# Patient Record
Sex: Female | Born: 1975 | Hispanic: No | Smoking: Current every day smoker
Health system: Southern US, Community
[De-identification: ages and names within clinical notes are randomized; demographics above are authoritative.]

## PROBLEM LIST (undated history)

## (undated) DIAGNOSIS — B49 Unspecified mycosis: Secondary | ICD-10-CM

## (undated) DIAGNOSIS — Z8619 Personal history of other infectious and parasitic diseases: Secondary | ICD-10-CM

## (undated) DIAGNOSIS — I82409 Acute embolism and thrombosis of unspecified deep veins of unspecified lower extremity: Secondary | ICD-10-CM

## (undated) DIAGNOSIS — G4730 Sleep apnea, unspecified: Secondary | ICD-10-CM

## (undated) DIAGNOSIS — I10 Essential (primary) hypertension: Secondary | ICD-10-CM

## (undated) DIAGNOSIS — M329 Systemic lupus erythematosus, unspecified: Secondary | ICD-10-CM

## (undated) DIAGNOSIS — N183 Chronic kidney disease, stage 3 (moderate): Secondary | ICD-10-CM

## (undated) DIAGNOSIS — N189 Chronic kidney disease, unspecified: Secondary | ICD-10-CM

## (undated) DIAGNOSIS — D649 Anemia, unspecified: Secondary | ICD-10-CM

## (undated) HISTORY — PX: TONSILECTOMY/ADENOIDECTOMY WITH MYRINGOTOMY: SHX6125

---

## 2010-11-06 HISTORY — PX: CHOLECYSTECTOMY: SHX55

## 2017-11-06 HISTORY — PX: OTHER SURGICAL HISTORY: SHX169

## 2017-11-06 HISTORY — PX: EMBOLIZATION: SHX1496

## 2017-11-06 HISTORY — PX: PEG TUBE PLACEMENT: SUR1034

## 2018-05-13 DIAGNOSIS — I82409 Acute embolism and thrombosis of unspecified deep veins of unspecified lower extremity: Secondary | ICD-10-CM | POA: Diagnosis not present

## 2018-05-13 DIAGNOSIS — Z9981 Dependence on supplemental oxygen: Secondary | ICD-10-CM

## 2018-05-13 DIAGNOSIS — M329 Systemic lupus erythematosus, unspecified: Secondary | ICD-10-CM

## 2018-05-13 DIAGNOSIS — J9620 Acute and chronic respiratory failure, unspecified whether with hypoxia or hypercapnia: Secondary | ICD-10-CM | POA: Diagnosis not present

## 2018-05-14 DIAGNOSIS — M329 Systemic lupus erythematosus, unspecified: Secondary | ICD-10-CM

## 2018-05-14 DIAGNOSIS — Z9981 Dependence on supplemental oxygen: Secondary | ICD-10-CM

## 2018-05-14 DIAGNOSIS — I82409 Acute embolism and thrombosis of unspecified deep veins of unspecified lower extremity: Secondary | ICD-10-CM

## 2018-05-14 DIAGNOSIS — J9620 Acute and chronic respiratory failure, unspecified whether with hypoxia or hypercapnia: Secondary | ICD-10-CM

## 2018-05-15 DIAGNOSIS — I82409 Acute embolism and thrombosis of unspecified deep veins of unspecified lower extremity: Secondary | ICD-10-CM | POA: Diagnosis not present

## 2018-05-15 DIAGNOSIS — M329 Systemic lupus erythematosus, unspecified: Secondary | ICD-10-CM

## 2018-05-15 DIAGNOSIS — J9620 Acute and chronic respiratory failure, unspecified whether with hypoxia or hypercapnia: Secondary | ICD-10-CM

## 2018-05-15 DIAGNOSIS — Z9981 Dependence on supplemental oxygen: Secondary | ICD-10-CM

## 2018-05-16 DIAGNOSIS — M329 Systemic lupus erythematosus, unspecified: Secondary | ICD-10-CM | POA: Diagnosis not present

## 2018-05-16 DIAGNOSIS — J9620 Acute and chronic respiratory failure, unspecified whether with hypoxia or hypercapnia: Secondary | ICD-10-CM

## 2018-05-16 DIAGNOSIS — Z9981 Dependence on supplemental oxygen: Secondary | ICD-10-CM | POA: Diagnosis not present

## 2018-05-16 DIAGNOSIS — I82409 Acute embolism and thrombosis of unspecified deep veins of unspecified lower extremity: Secondary | ICD-10-CM

## 2018-05-17 DIAGNOSIS — M329 Systemic lupus erythematosus, unspecified: Secondary | ICD-10-CM

## 2018-05-17 DIAGNOSIS — I82409 Acute embolism and thrombosis of unspecified deep veins of unspecified lower extremity: Secondary | ICD-10-CM

## 2018-05-17 DIAGNOSIS — J9620 Acute and chronic respiratory failure, unspecified whether with hypoxia or hypercapnia: Secondary | ICD-10-CM | POA: Diagnosis not present

## 2018-05-17 DIAGNOSIS — Z9981 Dependence on supplemental oxygen: Secondary | ICD-10-CM

## 2018-05-18 DIAGNOSIS — Z9981 Dependence on supplemental oxygen: Secondary | ICD-10-CM

## 2018-05-18 DIAGNOSIS — I82409 Acute embolism and thrombosis of unspecified deep veins of unspecified lower extremity: Secondary | ICD-10-CM

## 2018-05-18 DIAGNOSIS — M329 Systemic lupus erythematosus, unspecified: Secondary | ICD-10-CM | POA: Diagnosis not present

## 2018-05-18 DIAGNOSIS — J9620 Acute and chronic respiratory failure, unspecified whether with hypoxia or hypercapnia: Secondary | ICD-10-CM

## 2018-05-19 DIAGNOSIS — J9620 Acute and chronic respiratory failure, unspecified whether with hypoxia or hypercapnia: Secondary | ICD-10-CM | POA: Diagnosis not present

## 2018-05-19 DIAGNOSIS — I82409 Acute embolism and thrombosis of unspecified deep veins of unspecified lower extremity: Secondary | ICD-10-CM | POA: Diagnosis not present

## 2018-05-19 DIAGNOSIS — Z9981 Dependence on supplemental oxygen: Secondary | ICD-10-CM

## 2018-05-19 DIAGNOSIS — M329 Systemic lupus erythematosus, unspecified: Secondary | ICD-10-CM

## 2018-05-27 DIAGNOSIS — J9620 Acute and chronic respiratory failure, unspecified whether with hypoxia or hypercapnia: Secondary | ICD-10-CM | POA: Diagnosis not present

## 2018-05-27 DIAGNOSIS — Z9981 Dependence on supplemental oxygen: Secondary | ICD-10-CM

## 2018-05-27 DIAGNOSIS — I82409 Acute embolism and thrombosis of unspecified deep veins of unspecified lower extremity: Secondary | ICD-10-CM

## 2018-05-27 DIAGNOSIS — M329 Systemic lupus erythematosus, unspecified: Secondary | ICD-10-CM | POA: Diagnosis not present

## 2018-05-28 DIAGNOSIS — Z9981 Dependence on supplemental oxygen: Secondary | ICD-10-CM

## 2018-05-28 DIAGNOSIS — I82409 Acute embolism and thrombosis of unspecified deep veins of unspecified lower extremity: Secondary | ICD-10-CM

## 2018-05-28 DIAGNOSIS — M329 Systemic lupus erythematosus, unspecified: Secondary | ICD-10-CM

## 2018-05-28 DIAGNOSIS — J9620 Acute and chronic respiratory failure, unspecified whether with hypoxia or hypercapnia: Secondary | ICD-10-CM

## 2018-05-29 DIAGNOSIS — J9620 Acute and chronic respiratory failure, unspecified whether with hypoxia or hypercapnia: Secondary | ICD-10-CM

## 2018-05-29 DIAGNOSIS — M329 Systemic lupus erythematosus, unspecified: Secondary | ICD-10-CM

## 2018-05-29 DIAGNOSIS — I82409 Acute embolism and thrombosis of unspecified deep veins of unspecified lower extremity: Secondary | ICD-10-CM

## 2018-05-29 DIAGNOSIS — Z9981 Dependence on supplemental oxygen: Secondary | ICD-10-CM | POA: Diagnosis not present

## 2018-05-30 DIAGNOSIS — M329 Systemic lupus erythematosus, unspecified: Secondary | ICD-10-CM

## 2018-05-30 DIAGNOSIS — I82409 Acute embolism and thrombosis of unspecified deep veins of unspecified lower extremity: Secondary | ICD-10-CM

## 2018-05-30 DIAGNOSIS — Z9981 Dependence on supplemental oxygen: Secondary | ICD-10-CM

## 2018-05-30 DIAGNOSIS — J9620 Acute and chronic respiratory failure, unspecified whether with hypoxia or hypercapnia: Secondary | ICD-10-CM

## 2018-05-31 DIAGNOSIS — I82409 Acute embolism and thrombosis of unspecified deep veins of unspecified lower extremity: Secondary | ICD-10-CM | POA: Diagnosis not present

## 2018-05-31 DIAGNOSIS — Z9981 Dependence on supplemental oxygen: Secondary | ICD-10-CM | POA: Diagnosis not present

## 2018-05-31 DIAGNOSIS — J9620 Acute and chronic respiratory failure, unspecified whether with hypoxia or hypercapnia: Secondary | ICD-10-CM

## 2018-05-31 DIAGNOSIS — M329 Systemic lupus erythematosus, unspecified: Secondary | ICD-10-CM

## 2018-06-01 DIAGNOSIS — J9620 Acute and chronic respiratory failure, unspecified whether with hypoxia or hypercapnia: Secondary | ICD-10-CM

## 2018-06-01 DIAGNOSIS — M329 Systemic lupus erythematosus, unspecified: Secondary | ICD-10-CM

## 2018-06-01 DIAGNOSIS — I82409 Acute embolism and thrombosis of unspecified deep veins of unspecified lower extremity: Secondary | ICD-10-CM | POA: Diagnosis not present

## 2018-06-01 DIAGNOSIS — Z9981 Dependence on supplemental oxygen: Secondary | ICD-10-CM | POA: Diagnosis not present

## 2018-06-02 DIAGNOSIS — Z9981 Dependence on supplemental oxygen: Secondary | ICD-10-CM

## 2018-06-02 DIAGNOSIS — J9620 Acute and chronic respiratory failure, unspecified whether with hypoxia or hypercapnia: Secondary | ICD-10-CM | POA: Diagnosis not present

## 2018-06-02 DIAGNOSIS — M329 Systemic lupus erythematosus, unspecified: Secondary | ICD-10-CM

## 2018-06-02 DIAGNOSIS — I82409 Acute embolism and thrombosis of unspecified deep veins of unspecified lower extremity: Secondary | ICD-10-CM | POA: Diagnosis not present

## 2018-06-03 DIAGNOSIS — I82409 Acute embolism and thrombosis of unspecified deep veins of unspecified lower extremity: Secondary | ICD-10-CM | POA: Diagnosis not present

## 2018-06-03 DIAGNOSIS — M329 Systemic lupus erythematosus, unspecified: Secondary | ICD-10-CM

## 2018-06-03 DIAGNOSIS — J9620 Acute and chronic respiratory failure, unspecified whether with hypoxia or hypercapnia: Secondary | ICD-10-CM

## 2018-06-03 DIAGNOSIS — Z9981 Dependence on supplemental oxygen: Secondary | ICD-10-CM

## 2018-06-04 DIAGNOSIS — I82409 Acute embolism and thrombosis of unspecified deep veins of unspecified lower extremity: Secondary | ICD-10-CM

## 2018-06-04 DIAGNOSIS — M329 Systemic lupus erythematosus, unspecified: Secondary | ICD-10-CM | POA: Diagnosis not present

## 2018-06-04 DIAGNOSIS — J9620 Acute and chronic respiratory failure, unspecified whether with hypoxia or hypercapnia: Secondary | ICD-10-CM

## 2018-06-04 DIAGNOSIS — Z9981 Dependence on supplemental oxygen: Secondary | ICD-10-CM

## 2018-06-05 DIAGNOSIS — I82409 Acute embolism and thrombosis of unspecified deep veins of unspecified lower extremity: Secondary | ICD-10-CM | POA: Diagnosis not present

## 2018-06-05 DIAGNOSIS — M329 Systemic lupus erythematosus, unspecified: Secondary | ICD-10-CM

## 2018-06-05 DIAGNOSIS — Z9981 Dependence on supplemental oxygen: Secondary | ICD-10-CM | POA: Diagnosis not present

## 2018-06-05 DIAGNOSIS — J9620 Acute and chronic respiratory failure, unspecified whether with hypoxia or hypercapnia: Secondary | ICD-10-CM

## 2018-06-06 DIAGNOSIS — M329 Systemic lupus erythematosus, unspecified: Secondary | ICD-10-CM

## 2018-06-06 DIAGNOSIS — Z9981 Dependence on supplemental oxygen: Secondary | ICD-10-CM

## 2018-06-06 DIAGNOSIS — J9620 Acute and chronic respiratory failure, unspecified whether with hypoxia or hypercapnia: Secondary | ICD-10-CM

## 2018-06-06 DIAGNOSIS — I82409 Acute embolism and thrombosis of unspecified deep veins of unspecified lower extremity: Secondary | ICD-10-CM

## 2018-06-07 DIAGNOSIS — I82409 Acute embolism and thrombosis of unspecified deep veins of unspecified lower extremity: Secondary | ICD-10-CM

## 2018-06-07 DIAGNOSIS — M329 Systemic lupus erythematosus, unspecified: Secondary | ICD-10-CM

## 2018-06-07 DIAGNOSIS — Z9981 Dependence on supplemental oxygen: Secondary | ICD-10-CM

## 2018-06-07 DIAGNOSIS — J9620 Acute and chronic respiratory failure, unspecified whether with hypoxia or hypercapnia: Secondary | ICD-10-CM | POA: Diagnosis not present

## 2018-06-08 DIAGNOSIS — J9620 Acute and chronic respiratory failure, unspecified whether with hypoxia or hypercapnia: Secondary | ICD-10-CM

## 2018-06-08 DIAGNOSIS — Z9981 Dependence on supplemental oxygen: Secondary | ICD-10-CM | POA: Diagnosis not present

## 2018-06-08 DIAGNOSIS — I82409 Acute embolism and thrombosis of unspecified deep veins of unspecified lower extremity: Secondary | ICD-10-CM

## 2018-06-08 DIAGNOSIS — M329 Systemic lupus erythematosus, unspecified: Secondary | ICD-10-CM

## 2018-06-09 DIAGNOSIS — Z9981 Dependence on supplemental oxygen: Secondary | ICD-10-CM | POA: Diagnosis not present

## 2018-06-09 DIAGNOSIS — I82409 Acute embolism and thrombosis of unspecified deep veins of unspecified lower extremity: Secondary | ICD-10-CM

## 2018-06-09 DIAGNOSIS — J9620 Acute and chronic respiratory failure, unspecified whether with hypoxia or hypercapnia: Secondary | ICD-10-CM

## 2018-06-09 DIAGNOSIS — M329 Systemic lupus erythematosus, unspecified: Secondary | ICD-10-CM

## 2018-06-10 DIAGNOSIS — I82409 Acute embolism and thrombosis of unspecified deep veins of unspecified lower extremity: Secondary | ICD-10-CM

## 2018-06-10 DIAGNOSIS — J9620 Acute and chronic respiratory failure, unspecified whether with hypoxia or hypercapnia: Secondary | ICD-10-CM

## 2018-06-10 DIAGNOSIS — Z9981 Dependence on supplemental oxygen: Secondary | ICD-10-CM

## 2018-06-10 DIAGNOSIS — M329 Systemic lupus erythematosus, unspecified: Secondary | ICD-10-CM

## 2018-06-11 DIAGNOSIS — J9620 Acute and chronic respiratory failure, unspecified whether with hypoxia or hypercapnia: Secondary | ICD-10-CM

## 2018-06-11 DIAGNOSIS — I82409 Acute embolism and thrombosis of unspecified deep veins of unspecified lower extremity: Secondary | ICD-10-CM

## 2018-06-11 DIAGNOSIS — Z9981 Dependence on supplemental oxygen: Secondary | ICD-10-CM

## 2018-06-11 DIAGNOSIS — M329 Systemic lupus erythematosus, unspecified: Secondary | ICD-10-CM

## 2018-06-12 DIAGNOSIS — J9620 Acute and chronic respiratory failure, unspecified whether with hypoxia or hypercapnia: Secondary | ICD-10-CM

## 2018-06-12 DIAGNOSIS — I82409 Acute embolism and thrombosis of unspecified deep veins of unspecified lower extremity: Secondary | ICD-10-CM

## 2018-06-12 DIAGNOSIS — M329 Systemic lupus erythematosus, unspecified: Secondary | ICD-10-CM | POA: Diagnosis not present

## 2018-06-12 DIAGNOSIS — Z9981 Dependence on supplemental oxygen: Secondary | ICD-10-CM

## 2018-06-13 DIAGNOSIS — J9620 Acute and chronic respiratory failure, unspecified whether with hypoxia or hypercapnia: Secondary | ICD-10-CM

## 2018-06-13 DIAGNOSIS — Z9981 Dependence on supplemental oxygen: Secondary | ICD-10-CM | POA: Diagnosis not present

## 2018-06-13 DIAGNOSIS — I82409 Acute embolism and thrombosis of unspecified deep veins of unspecified lower extremity: Secondary | ICD-10-CM | POA: Diagnosis not present

## 2018-06-13 DIAGNOSIS — M329 Systemic lupus erythematosus, unspecified: Secondary | ICD-10-CM

## 2018-06-14 DIAGNOSIS — I82409 Acute embolism and thrombosis of unspecified deep veins of unspecified lower extremity: Secondary | ICD-10-CM | POA: Diagnosis not present

## 2018-06-14 DIAGNOSIS — J9620 Acute and chronic respiratory failure, unspecified whether with hypoxia or hypercapnia: Secondary | ICD-10-CM | POA: Diagnosis not present

## 2018-06-14 DIAGNOSIS — Z9981 Dependence on supplemental oxygen: Secondary | ICD-10-CM

## 2018-06-14 DIAGNOSIS — M329 Systemic lupus erythematosus, unspecified: Secondary | ICD-10-CM | POA: Diagnosis not present

## 2018-06-15 DIAGNOSIS — I82409 Acute embolism and thrombosis of unspecified deep veins of unspecified lower extremity: Secondary | ICD-10-CM

## 2018-06-15 DIAGNOSIS — Z9981 Dependence on supplemental oxygen: Secondary | ICD-10-CM

## 2018-06-15 DIAGNOSIS — M329 Systemic lupus erythematosus, unspecified: Secondary | ICD-10-CM | POA: Diagnosis not present

## 2018-06-15 DIAGNOSIS — J9620 Acute and chronic respiratory failure, unspecified whether with hypoxia or hypercapnia: Secondary | ICD-10-CM | POA: Diagnosis not present

## 2018-06-16 DIAGNOSIS — J9620 Acute and chronic respiratory failure, unspecified whether with hypoxia or hypercapnia: Secondary | ICD-10-CM

## 2018-06-16 DIAGNOSIS — M329 Systemic lupus erythematosus, unspecified: Secondary | ICD-10-CM

## 2018-06-16 DIAGNOSIS — I82409 Acute embolism and thrombosis of unspecified deep veins of unspecified lower extremity: Secondary | ICD-10-CM

## 2018-06-16 DIAGNOSIS — Z9981 Dependence on supplemental oxygen: Secondary | ICD-10-CM

## 2018-07-01 DIAGNOSIS — J9620 Acute and chronic respiratory failure, unspecified whether with hypoxia or hypercapnia: Secondary | ICD-10-CM | POA: Diagnosis not present

## 2018-07-01 DIAGNOSIS — M329 Systemic lupus erythematosus, unspecified: Secondary | ICD-10-CM | POA: Diagnosis not present

## 2018-07-01 DIAGNOSIS — Z9981 Dependence on supplemental oxygen: Secondary | ICD-10-CM

## 2018-07-01 DIAGNOSIS — I82409 Acute embolism and thrombosis of unspecified deep veins of unspecified lower extremity: Secondary | ICD-10-CM

## 2018-07-02 DIAGNOSIS — Z9981 Dependence on supplemental oxygen: Secondary | ICD-10-CM | POA: Diagnosis not present

## 2018-07-02 DIAGNOSIS — J9620 Acute and chronic respiratory failure, unspecified whether with hypoxia or hypercapnia: Secondary | ICD-10-CM

## 2018-07-02 DIAGNOSIS — I82409 Acute embolism and thrombosis of unspecified deep veins of unspecified lower extremity: Secondary | ICD-10-CM | POA: Diagnosis not present

## 2018-07-02 DIAGNOSIS — M329 Systemic lupus erythematosus, unspecified: Secondary | ICD-10-CM | POA: Diagnosis not present

## 2018-07-03 DIAGNOSIS — Z9981 Dependence on supplemental oxygen: Secondary | ICD-10-CM | POA: Diagnosis not present

## 2018-07-03 DIAGNOSIS — J9620 Acute and chronic respiratory failure, unspecified whether with hypoxia or hypercapnia: Secondary | ICD-10-CM

## 2018-07-03 DIAGNOSIS — M329 Systemic lupus erythematosus, unspecified: Secondary | ICD-10-CM

## 2018-07-03 DIAGNOSIS — I82409 Acute embolism and thrombosis of unspecified deep veins of unspecified lower extremity: Secondary | ICD-10-CM | POA: Diagnosis not present

## 2018-07-04 DIAGNOSIS — M329 Systemic lupus erythematosus, unspecified: Secondary | ICD-10-CM

## 2018-07-04 DIAGNOSIS — Z9981 Dependence on supplemental oxygen: Secondary | ICD-10-CM | POA: Diagnosis not present

## 2018-07-04 DIAGNOSIS — J9620 Acute and chronic respiratory failure, unspecified whether with hypoxia or hypercapnia: Secondary | ICD-10-CM | POA: Diagnosis not present

## 2018-07-04 DIAGNOSIS — I82409 Acute embolism and thrombosis of unspecified deep veins of unspecified lower extremity: Secondary | ICD-10-CM

## 2018-07-05 DIAGNOSIS — J9620 Acute and chronic respiratory failure, unspecified whether with hypoxia or hypercapnia: Secondary | ICD-10-CM | POA: Diagnosis not present

## 2018-07-05 DIAGNOSIS — Z9981 Dependence on supplemental oxygen: Secondary | ICD-10-CM | POA: Diagnosis not present

## 2018-07-05 DIAGNOSIS — M329 Systemic lupus erythematosus, unspecified: Secondary | ICD-10-CM

## 2018-07-05 DIAGNOSIS — I82409 Acute embolism and thrombosis of unspecified deep veins of unspecified lower extremity: Secondary | ICD-10-CM

## 2018-07-06 DIAGNOSIS — Z9981 Dependence on supplemental oxygen: Secondary | ICD-10-CM

## 2018-07-06 DIAGNOSIS — M329 Systemic lupus erythematosus, unspecified: Secondary | ICD-10-CM

## 2018-07-06 DIAGNOSIS — J9620 Acute and chronic respiratory failure, unspecified whether with hypoxia or hypercapnia: Secondary | ICD-10-CM

## 2018-07-06 DIAGNOSIS — I82409 Acute embolism and thrombosis of unspecified deep veins of unspecified lower extremity: Secondary | ICD-10-CM

## 2018-07-07 DIAGNOSIS — M329 Systemic lupus erythematosus, unspecified: Secondary | ICD-10-CM | POA: Diagnosis not present

## 2018-07-07 DIAGNOSIS — I82409 Acute embolism and thrombosis of unspecified deep veins of unspecified lower extremity: Secondary | ICD-10-CM

## 2018-07-07 DIAGNOSIS — Z9981 Dependence on supplemental oxygen: Secondary | ICD-10-CM | POA: Diagnosis not present

## 2018-07-07 DIAGNOSIS — J9620 Acute and chronic respiratory failure, unspecified whether with hypoxia or hypercapnia: Secondary | ICD-10-CM | POA: Diagnosis not present

## 2018-07-12 DIAGNOSIS — I82409 Acute embolism and thrombosis of unspecified deep veins of unspecified lower extremity: Secondary | ICD-10-CM

## 2018-07-12 DIAGNOSIS — J9620 Acute and chronic respiratory failure, unspecified whether with hypoxia or hypercapnia: Secondary | ICD-10-CM | POA: Diagnosis not present

## 2018-07-12 DIAGNOSIS — Z9981 Dependence on supplemental oxygen: Secondary | ICD-10-CM | POA: Diagnosis not present

## 2018-07-12 DIAGNOSIS — M329 Systemic lupus erythematosus, unspecified: Secondary | ICD-10-CM | POA: Diagnosis not present

## 2018-07-13 DIAGNOSIS — Z9981 Dependence on supplemental oxygen: Secondary | ICD-10-CM | POA: Diagnosis not present

## 2018-07-13 DIAGNOSIS — J9620 Acute and chronic respiratory failure, unspecified whether with hypoxia or hypercapnia: Secondary | ICD-10-CM | POA: Diagnosis not present

## 2018-07-13 DIAGNOSIS — I82409 Acute embolism and thrombosis of unspecified deep veins of unspecified lower extremity: Secondary | ICD-10-CM

## 2018-07-13 DIAGNOSIS — M329 Systemic lupus erythematosus, unspecified: Secondary | ICD-10-CM | POA: Diagnosis not present

## 2018-07-14 DIAGNOSIS — Z9981 Dependence on supplemental oxygen: Secondary | ICD-10-CM

## 2018-07-14 DIAGNOSIS — M329 Systemic lupus erythematosus, unspecified: Secondary | ICD-10-CM

## 2018-07-14 DIAGNOSIS — J9620 Acute and chronic respiratory failure, unspecified whether with hypoxia or hypercapnia: Secondary | ICD-10-CM

## 2018-07-14 DIAGNOSIS — I82409 Acute embolism and thrombosis of unspecified deep veins of unspecified lower extremity: Secondary | ICD-10-CM | POA: Diagnosis not present

## 2018-07-15 DIAGNOSIS — I82409 Acute embolism and thrombosis of unspecified deep veins of unspecified lower extremity: Secondary | ICD-10-CM | POA: Diagnosis not present

## 2018-07-15 DIAGNOSIS — J9620 Acute and chronic respiratory failure, unspecified whether with hypoxia or hypercapnia: Secondary | ICD-10-CM | POA: Diagnosis not present

## 2018-07-15 DIAGNOSIS — Z9981 Dependence on supplemental oxygen: Secondary | ICD-10-CM | POA: Diagnosis not present

## 2018-07-15 DIAGNOSIS — M329 Systemic lupus erythematosus, unspecified: Secondary | ICD-10-CM | POA: Diagnosis not present

## 2018-07-16 DIAGNOSIS — Z9981 Dependence on supplemental oxygen: Secondary | ICD-10-CM

## 2018-07-16 DIAGNOSIS — M329 Systemic lupus erythematosus, unspecified: Secondary | ICD-10-CM | POA: Diagnosis not present

## 2018-07-16 DIAGNOSIS — J9620 Acute and chronic respiratory failure, unspecified whether with hypoxia or hypercapnia: Secondary | ICD-10-CM | POA: Diagnosis not present

## 2018-07-16 DIAGNOSIS — I82409 Acute embolism and thrombosis of unspecified deep veins of unspecified lower extremity: Secondary | ICD-10-CM

## 2018-07-17 DIAGNOSIS — I82409 Acute embolism and thrombosis of unspecified deep veins of unspecified lower extremity: Secondary | ICD-10-CM

## 2018-07-17 DIAGNOSIS — J9620 Acute and chronic respiratory failure, unspecified whether with hypoxia or hypercapnia: Secondary | ICD-10-CM | POA: Diagnosis not present

## 2018-07-17 DIAGNOSIS — M329 Systemic lupus erythematosus, unspecified: Secondary | ICD-10-CM

## 2018-07-17 DIAGNOSIS — Z9981 Dependence on supplemental oxygen: Secondary | ICD-10-CM

## 2018-07-18 DIAGNOSIS — M329 Systemic lupus erythematosus, unspecified: Secondary | ICD-10-CM

## 2018-07-18 DIAGNOSIS — Z9981 Dependence on supplemental oxygen: Secondary | ICD-10-CM

## 2018-07-18 DIAGNOSIS — J9620 Acute and chronic respiratory failure, unspecified whether with hypoxia or hypercapnia: Secondary | ICD-10-CM | POA: Diagnosis not present

## 2018-07-18 DIAGNOSIS — I82409 Acute embolism and thrombosis of unspecified deep veins of unspecified lower extremity: Secondary | ICD-10-CM

## 2018-07-29 DIAGNOSIS — M329 Systemic lupus erythematosus, unspecified: Secondary | ICD-10-CM

## 2018-07-29 DIAGNOSIS — J9620 Acute and chronic respiratory failure, unspecified whether with hypoxia or hypercapnia: Secondary | ICD-10-CM | POA: Diagnosis not present

## 2018-07-29 DIAGNOSIS — Z9981 Dependence on supplemental oxygen: Secondary | ICD-10-CM | POA: Diagnosis not present

## 2018-07-29 DIAGNOSIS — I82409 Acute embolism and thrombosis of unspecified deep veins of unspecified lower extremity: Secondary | ICD-10-CM

## 2018-07-30 DIAGNOSIS — J9620 Acute and chronic respiratory failure, unspecified whether with hypoxia or hypercapnia: Secondary | ICD-10-CM | POA: Diagnosis not present

## 2018-07-30 DIAGNOSIS — M329 Systemic lupus erythematosus, unspecified: Secondary | ICD-10-CM

## 2018-07-30 DIAGNOSIS — Z9981 Dependence on supplemental oxygen: Secondary | ICD-10-CM

## 2018-07-30 DIAGNOSIS — I82409 Acute embolism and thrombosis of unspecified deep veins of unspecified lower extremity: Secondary | ICD-10-CM | POA: Diagnosis not present

## 2018-07-31 DIAGNOSIS — M329 Systemic lupus erythematosus, unspecified: Secondary | ICD-10-CM

## 2018-07-31 DIAGNOSIS — Z9981 Dependence on supplemental oxygen: Secondary | ICD-10-CM

## 2018-07-31 DIAGNOSIS — I82409 Acute embolism and thrombosis of unspecified deep veins of unspecified lower extremity: Secondary | ICD-10-CM | POA: Diagnosis not present

## 2018-07-31 DIAGNOSIS — J9620 Acute and chronic respiratory failure, unspecified whether with hypoxia or hypercapnia: Secondary | ICD-10-CM | POA: Diagnosis not present

## 2018-08-01 DIAGNOSIS — M329 Systemic lupus erythematosus, unspecified: Secondary | ICD-10-CM

## 2018-08-01 DIAGNOSIS — J9620 Acute and chronic respiratory failure, unspecified whether with hypoxia or hypercapnia: Secondary | ICD-10-CM

## 2018-08-01 DIAGNOSIS — I82409 Acute embolism and thrombosis of unspecified deep veins of unspecified lower extremity: Secondary | ICD-10-CM

## 2018-08-01 DIAGNOSIS — Z9981 Dependence on supplemental oxygen: Secondary | ICD-10-CM

## 2018-08-02 DIAGNOSIS — M329 Systemic lupus erythematosus, unspecified: Secondary | ICD-10-CM | POA: Diagnosis not present

## 2018-08-02 DIAGNOSIS — Z9981 Dependence on supplemental oxygen: Secondary | ICD-10-CM | POA: Diagnosis not present

## 2018-08-02 DIAGNOSIS — I82409 Acute embolism and thrombosis of unspecified deep veins of unspecified lower extremity: Secondary | ICD-10-CM

## 2018-08-02 DIAGNOSIS — J9620 Acute and chronic respiratory failure, unspecified whether with hypoxia or hypercapnia: Secondary | ICD-10-CM | POA: Diagnosis not present

## 2018-08-03 DIAGNOSIS — Z9981 Dependence on supplemental oxygen: Secondary | ICD-10-CM

## 2018-08-03 DIAGNOSIS — M329 Systemic lupus erythematosus, unspecified: Secondary | ICD-10-CM

## 2018-08-03 DIAGNOSIS — I82409 Acute embolism and thrombosis of unspecified deep veins of unspecified lower extremity: Secondary | ICD-10-CM

## 2018-08-03 DIAGNOSIS — J9620 Acute and chronic respiratory failure, unspecified whether with hypoxia or hypercapnia: Secondary | ICD-10-CM | POA: Diagnosis not present

## 2018-08-04 DIAGNOSIS — Z9981 Dependence on supplemental oxygen: Secondary | ICD-10-CM

## 2018-08-04 DIAGNOSIS — M329 Systemic lupus erythematosus, unspecified: Secondary | ICD-10-CM

## 2018-08-04 DIAGNOSIS — I82409 Acute embolism and thrombosis of unspecified deep veins of unspecified lower extremity: Secondary | ICD-10-CM

## 2018-08-04 DIAGNOSIS — J9620 Acute and chronic respiratory failure, unspecified whether with hypoxia or hypercapnia: Secondary | ICD-10-CM | POA: Diagnosis not present

## 2018-08-12 DIAGNOSIS — M329 Systemic lupus erythematosus, unspecified: Secondary | ICD-10-CM

## 2018-08-12 DIAGNOSIS — I82409 Acute embolism and thrombosis of unspecified deep veins of unspecified lower extremity: Secondary | ICD-10-CM

## 2018-08-12 DIAGNOSIS — J9620 Acute and chronic respiratory failure, unspecified whether with hypoxia or hypercapnia: Secondary | ICD-10-CM | POA: Diagnosis not present

## 2018-08-12 DIAGNOSIS — Z9981 Dependence on supplemental oxygen: Secondary | ICD-10-CM | POA: Diagnosis not present

## 2018-08-13 DIAGNOSIS — Z9981 Dependence on supplemental oxygen: Secondary | ICD-10-CM | POA: Diagnosis not present

## 2018-08-13 DIAGNOSIS — J9620 Acute and chronic respiratory failure, unspecified whether with hypoxia or hypercapnia: Secondary | ICD-10-CM | POA: Diagnosis not present

## 2018-08-13 DIAGNOSIS — M329 Systemic lupus erythematosus, unspecified: Secondary | ICD-10-CM | POA: Diagnosis not present

## 2018-08-13 DIAGNOSIS — I82409 Acute embolism and thrombosis of unspecified deep veins of unspecified lower extremity: Secondary | ICD-10-CM | POA: Diagnosis not present

## 2018-08-14 DIAGNOSIS — Z9981 Dependence on supplemental oxygen: Secondary | ICD-10-CM | POA: Diagnosis not present

## 2018-08-14 DIAGNOSIS — I82409 Acute embolism and thrombosis of unspecified deep veins of unspecified lower extremity: Secondary | ICD-10-CM

## 2018-08-14 DIAGNOSIS — M329 Systemic lupus erythematosus, unspecified: Secondary | ICD-10-CM

## 2018-08-14 DIAGNOSIS — J9620 Acute and chronic respiratory failure, unspecified whether with hypoxia or hypercapnia: Secondary | ICD-10-CM

## 2018-08-15 DIAGNOSIS — Z9981 Dependence on supplemental oxygen: Secondary | ICD-10-CM

## 2018-08-15 DIAGNOSIS — J9620 Acute and chronic respiratory failure, unspecified whether with hypoxia or hypercapnia: Secondary | ICD-10-CM | POA: Diagnosis not present

## 2018-08-15 DIAGNOSIS — I82409 Acute embolism and thrombosis of unspecified deep veins of unspecified lower extremity: Secondary | ICD-10-CM

## 2018-08-15 DIAGNOSIS — M329 Systemic lupus erythematosus, unspecified: Secondary | ICD-10-CM | POA: Diagnosis not present

## 2018-08-16 DIAGNOSIS — J9620 Acute and chronic respiratory failure, unspecified whether with hypoxia or hypercapnia: Secondary | ICD-10-CM

## 2018-08-16 DIAGNOSIS — M329 Systemic lupus erythematosus, unspecified: Secondary | ICD-10-CM

## 2018-08-16 DIAGNOSIS — Z9981 Dependence on supplemental oxygen: Secondary | ICD-10-CM | POA: Diagnosis not present

## 2018-08-16 DIAGNOSIS — I82409 Acute embolism and thrombosis of unspecified deep veins of unspecified lower extremity: Secondary | ICD-10-CM

## 2018-08-17 DIAGNOSIS — I82409 Acute embolism and thrombosis of unspecified deep veins of unspecified lower extremity: Secondary | ICD-10-CM

## 2018-08-17 DIAGNOSIS — Z9981 Dependence on supplemental oxygen: Secondary | ICD-10-CM

## 2018-08-17 DIAGNOSIS — M329 Systemic lupus erythematosus, unspecified: Secondary | ICD-10-CM | POA: Diagnosis not present

## 2018-08-17 DIAGNOSIS — J9620 Acute and chronic respiratory failure, unspecified whether with hypoxia or hypercapnia: Secondary | ICD-10-CM

## 2018-08-18 DIAGNOSIS — M329 Systemic lupus erythematosus, unspecified: Secondary | ICD-10-CM | POA: Diagnosis not present

## 2018-08-18 DIAGNOSIS — J9620 Acute and chronic respiratory failure, unspecified whether with hypoxia or hypercapnia: Secondary | ICD-10-CM | POA: Diagnosis not present

## 2018-08-18 DIAGNOSIS — I82409 Acute embolism and thrombosis of unspecified deep veins of unspecified lower extremity: Secondary | ICD-10-CM

## 2018-08-18 DIAGNOSIS — Z9981 Dependence on supplemental oxygen: Secondary | ICD-10-CM

## 2018-08-25 DIAGNOSIS — M329 Systemic lupus erythematosus, unspecified: Secondary | ICD-10-CM | POA: Diagnosis not present

## 2018-08-25 DIAGNOSIS — Z9981 Dependence on supplemental oxygen: Secondary | ICD-10-CM

## 2018-08-25 DIAGNOSIS — J9620 Acute and chronic respiratory failure, unspecified whether with hypoxia or hypercapnia: Secondary | ICD-10-CM

## 2018-08-25 DIAGNOSIS — I82409 Acute embolism and thrombosis of unspecified deep veins of unspecified lower extremity: Secondary | ICD-10-CM

## 2018-08-26 DIAGNOSIS — J9620 Acute and chronic respiratory failure, unspecified whether with hypoxia or hypercapnia: Secondary | ICD-10-CM | POA: Diagnosis not present

## 2018-08-26 DIAGNOSIS — Z9981 Dependence on supplemental oxygen: Secondary | ICD-10-CM

## 2018-08-26 DIAGNOSIS — M329 Systemic lupus erythematosus, unspecified: Secondary | ICD-10-CM

## 2018-08-26 DIAGNOSIS — I82409 Acute embolism and thrombosis of unspecified deep veins of unspecified lower extremity: Secondary | ICD-10-CM

## 2018-08-27 DIAGNOSIS — Z9981 Dependence on supplemental oxygen: Secondary | ICD-10-CM

## 2018-08-27 DIAGNOSIS — I82409 Acute embolism and thrombosis of unspecified deep veins of unspecified lower extremity: Secondary | ICD-10-CM

## 2018-08-27 DIAGNOSIS — M329 Systemic lupus erythematosus, unspecified: Secondary | ICD-10-CM | POA: Diagnosis not present

## 2018-08-27 DIAGNOSIS — J9620 Acute and chronic respiratory failure, unspecified whether with hypoxia or hypercapnia: Secondary | ICD-10-CM | POA: Diagnosis not present

## 2018-08-28 DIAGNOSIS — I82409 Acute embolism and thrombosis of unspecified deep veins of unspecified lower extremity: Secondary | ICD-10-CM

## 2018-08-28 DIAGNOSIS — Z9981 Dependence on supplemental oxygen: Secondary | ICD-10-CM

## 2018-08-28 DIAGNOSIS — J9620 Acute and chronic respiratory failure, unspecified whether with hypoxia or hypercapnia: Secondary | ICD-10-CM | POA: Diagnosis not present

## 2018-08-28 DIAGNOSIS — M329 Systemic lupus erythematosus, unspecified: Secondary | ICD-10-CM | POA: Diagnosis not present

## 2018-08-29 DIAGNOSIS — J9620 Acute and chronic respiratory failure, unspecified whether with hypoxia or hypercapnia: Secondary | ICD-10-CM

## 2018-08-29 DIAGNOSIS — M329 Systemic lupus erythematosus, unspecified: Secondary | ICD-10-CM

## 2018-08-29 DIAGNOSIS — I82409 Acute embolism and thrombosis of unspecified deep veins of unspecified lower extremity: Secondary | ICD-10-CM | POA: Diagnosis not present

## 2018-08-29 DIAGNOSIS — Z9981 Dependence on supplemental oxygen: Secondary | ICD-10-CM

## 2018-08-30 DIAGNOSIS — J9620 Acute and chronic respiratory failure, unspecified whether with hypoxia or hypercapnia: Secondary | ICD-10-CM | POA: Diagnosis not present

## 2018-08-30 DIAGNOSIS — Z9981 Dependence on supplemental oxygen: Secondary | ICD-10-CM

## 2018-08-30 DIAGNOSIS — I82409 Acute embolism and thrombosis of unspecified deep veins of unspecified lower extremity: Secondary | ICD-10-CM | POA: Diagnosis not present

## 2018-08-30 DIAGNOSIS — M329 Systemic lupus erythematosus, unspecified: Secondary | ICD-10-CM | POA: Diagnosis not present

## 2018-08-31 DIAGNOSIS — Z9981 Dependence on supplemental oxygen: Secondary | ICD-10-CM | POA: Diagnosis not present

## 2018-08-31 DIAGNOSIS — I82409 Acute embolism and thrombosis of unspecified deep veins of unspecified lower extremity: Secondary | ICD-10-CM | POA: Diagnosis not present

## 2018-08-31 DIAGNOSIS — M329 Systemic lupus erythematosus, unspecified: Secondary | ICD-10-CM | POA: Diagnosis not present

## 2018-08-31 DIAGNOSIS — J9620 Acute and chronic respiratory failure, unspecified whether with hypoxia or hypercapnia: Secondary | ICD-10-CM

## 2018-09-01 NOTE — H&P (Deleted)
42 y/o female with recent ventilator dependent respiratory failure secondary to HCAP, Pseudomonas ans MSSA sepsis treated with antibiotics, LUE DVT on warfarin, AKI/CKD, blood loss anemia from pelvic hematoma treated with left inferior epigastric artery embolization, hypertension, SLE with hypercoagulable state, antiphospholipid syndrome.   Patient is now at [place], has been decanulated and tolerating nasal canula well. Recent blood culture showed yeast growth from aerobic bottle only. Transthoracic echocardiogram did not show any valvular vegetation. Patient is referred to me for TEE evaluation to rule out vegetation or any other signs of endocarditis. This is note is based on chart review only.   Labs 08/26/2018: H/H 8.6/25.5. MCV 93. Platelets 296. Glucose 84. BUN/Cr 30/1.3. eGFR 47/57. Na/K 135/5.2 INR 3.0  Echocardiogram 08/15/2018: LVEF 55-60% Mild left atrial enlargement No evidence of valvular vegetation Small pericardial effusion

## 2018-09-03 ENCOUNTER — Encounter (HOSPITAL_COMMUNITY)
Admission: RE | Disposition: A | Discharge status: Home / Self Care | Payer: Self-pay | Source: Ambulatory Visit | Attending: Cardiology

## 2018-09-03 ENCOUNTER — Encounter (HOSPITAL_COMMUNITY): Payer: Self-pay

## 2018-09-03 ENCOUNTER — Ambulatory Visit (HOSPITAL_COMMUNITY): Payer: Medicare Other | Admitting: Anesthesiology

## 2018-09-03 ENCOUNTER — Ambulatory Visit (HOSPITAL_COMMUNITY)
Admission: RE | Admit: 2018-09-03 | Discharge: 2018-09-03 | Disposition: A | Discharge status: Home / Self Care | Payer: Medicare Other | Source: Ambulatory Visit | Attending: Cardiology | Admitting: Cardiology

## 2018-09-03 ENCOUNTER — Ambulatory Visit (HOSPITAL_COMMUNITY): Payer: Medicare Other

## 2018-09-03 ENCOUNTER — Other Ambulatory Visit: Payer: Self-pay

## 2018-09-03 DIAGNOSIS — F17210 Nicotine dependence, cigarettes, uncomplicated: Secondary | ICD-10-CM | POA: Diagnosis not present

## 2018-09-03 DIAGNOSIS — M329 Systemic lupus erythematosus, unspecified: Secondary | ICD-10-CM | POA: Diagnosis not present

## 2018-09-03 DIAGNOSIS — D6861 Antiphospholipid syndrome: Secondary | ICD-10-CM | POA: Diagnosis not present

## 2018-09-03 DIAGNOSIS — Z7901 Long term (current) use of anticoagulants: Secondary | ICD-10-CM | POA: Diagnosis not present

## 2018-09-03 DIAGNOSIS — K219 Gastro-esophageal reflux disease without esophagitis: Secondary | ICD-10-CM | POA: Insufficient documentation

## 2018-09-03 DIAGNOSIS — Z86718 Personal history of other venous thrombosis and embolism: Secondary | ICD-10-CM | POA: Insufficient documentation

## 2018-09-03 DIAGNOSIS — B49 Unspecified mycosis: Secondary | ICD-10-CM

## 2018-09-03 DIAGNOSIS — Z931 Gastrostomy status: Secondary | ICD-10-CM | POA: Insufficient documentation

## 2018-09-03 DIAGNOSIS — Z9911 Dependence on respirator [ventilator] status: Secondary | ICD-10-CM | POA: Diagnosis not present

## 2018-09-03 DIAGNOSIS — G4730 Sleep apnea, unspecified: Secondary | ICD-10-CM | POA: Insufficient documentation

## 2018-09-03 DIAGNOSIS — Z720 Tobacco use: Secondary | ICD-10-CM

## 2018-09-03 DIAGNOSIS — I340 Nonrheumatic mitral (valve) insufficiency: Secondary | ICD-10-CM | POA: Diagnosis not present

## 2018-09-03 DIAGNOSIS — N183 Chronic kidney disease, stage 3 unspecified: Secondary | ICD-10-CM | POA: Diagnosis present

## 2018-09-03 DIAGNOSIS — D500 Iron deficiency anemia secondary to blood loss (chronic): Secondary | ICD-10-CM | POA: Insufficient documentation

## 2018-09-03 DIAGNOSIS — I129 Hypertensive chronic kidney disease with stage 1 through stage 4 chronic kidney disease, or unspecified chronic kidney disease: Secondary | ICD-10-CM | POA: Diagnosis present

## 2018-09-03 DIAGNOSIS — N179 Acute kidney failure, unspecified: Secondary | ICD-10-CM | POA: Diagnosis not present

## 2018-09-03 DIAGNOSIS — I10 Essential (primary) hypertension: Secondary | ICD-10-CM | POA: Diagnosis present

## 2018-09-03 DIAGNOSIS — D649 Anemia, unspecified: Secondary | ICD-10-CM | POA: Diagnosis present

## 2018-09-03 DIAGNOSIS — I82409 Acute embolism and thrombosis of unspecified deep veins of unspecified lower extremity: Secondary | ICD-10-CM | POA: Diagnosis present

## 2018-09-03 DIAGNOSIS — Z8619 Personal history of other infectious and parasitic diseases: Secondary | ICD-10-CM | POA: Diagnosis present

## 2018-09-03 HISTORY — DX: Chronic kidney disease, stage 3 (moderate): N18.3

## 2018-09-03 HISTORY — DX: Unspecified mycosis: B49

## 2018-09-03 HISTORY — DX: Systemic lupus erythematosus, unspecified: M32.9

## 2018-09-03 HISTORY — DX: Personal history of other infectious and parasitic diseases: Z86.19

## 2018-09-03 HISTORY — DX: Chronic kidney disease, unspecified: N18.9

## 2018-09-03 HISTORY — DX: Anemia, unspecified: D64.9

## 2018-09-03 HISTORY — DX: Essential (primary) hypertension: I10

## 2018-09-03 HISTORY — DX: Acute embolism and thrombosis of unspecified deep veins of unspecified lower extremity: I82.409

## 2018-09-03 HISTORY — PX: TEE WITHOUT CARDIOVERSION: SHX5443

## 2018-09-03 HISTORY — DX: Sleep apnea, unspecified: G47.30

## 2018-09-03 HISTORY — DX: Chronic kidney disease, stage 3 unspecified: N18.30

## 2018-09-03 LAB — PROTIME-INR
INR: 2.65
Prothrombin Time: 27.9 seconds — ABNORMAL HIGH (ref 11.4–15.2)

## 2018-09-03 SURGERY — ECHOCARDIOGRAM, TRANSESOPHAGEAL
Anesthesia: Monitor Anesthesia Care

## 2018-09-03 MED ORDER — PROPOFOL 10 MG/ML IV BOLUS
INTRAVENOUS | Status: DC | PRN
  Administered 2018-09-03: 30 mg via INTRAVENOUS
  Administered 2018-09-03 (×2): 20 mg via INTRAVENOUS
  Administered 2018-09-03: 40 mg via INTRAVENOUS
  Administered 2018-09-03: 30 mg via INTRAVENOUS
  Administered 2018-09-03: 20 mg via INTRAVENOUS
  Administered 2018-09-03: 40 mg via INTRAVENOUS
  Administered 2018-09-03: 30 mg via INTRAVENOUS
  Administered 2018-09-03 (×2): 20 mg via INTRAVENOUS

## 2018-09-03 MED ORDER — SODIUM CHLORIDE 0.9 % IV SOLN
INTRAVENOUS | Status: DC
  Administered 2018-09-03: 14:00:00 via INTRAVENOUS

## 2018-09-03 MED ORDER — PROPOFOL 500 MG/50ML IV EMUL
INTRAVENOUS | Status: DC | PRN
  Administered 2018-09-03: 75 ug/kg/min via INTRAVENOUS

## 2018-09-03 MED ORDER — LIDOCAINE HCL (CARDIAC) PF 100 MG/5ML IV SOSY
PREFILLED_SYRINGE | INTRAVENOUS | Status: DC | PRN
  Administered 2018-09-03: 60 mg via INTRAVENOUS

## 2018-09-03 NOTE — Anesthesia Preprocedure Evaluation (Addendum)
Anesthesia Evaluation  Patient identified by MRN, date of birth, ID band Patient awake    Reviewed: Allergy & Precautions, NPO status , Patient's Chart, lab work & pertinent test results, reviewed documented beta blocker date and time   Airway Mallampati: IV  TM Distance: >3 FB   Mouth opening: Limited Mouth Opening  Dental  (+) Chipped, Poor Dentition, Dental Advisory Given,    Pulmonary sleep apnea , pneumonia (recent ventilator dependence 2/2 HCAP, now decannulated), Current Smoker,    breath sounds clear to auscultation       Cardiovascular hypertension, Pt. on home beta blockers and Pt. on medications + DVT (on coumadin)   Rhythm:Regular Rate:Normal     Neuro/Psych negative neurological ROS  negative psych ROS   GI/Hepatic Neg liver ROS, GERD  Medicated,  Endo/Other  negative endocrine ROS  Renal/GU Renal diseaseCKD Stage III  negative genitourinary   Musculoskeletal negative musculoskeletal ROS (+)   Abdominal   Peds  Hematology negative hematology ROS (+)   Anesthesia Other Findings TEE to r/o endocarditis 2/2 fungemia  H/o SLE, antiphospholipid syndrome, MSSA and pseudomonas sepsis now resolved  Drank chocolate milk at 0800  Reproductive/Obstetrics                           Anesthesia Physical Anesthesia Plan  ASA: III  Anesthesia Plan: MAC   Post-op Pain Management:    Induction: Intravenous  PONV Risk Score and Plan: 1 and Treatment may vary due to age or medical condition and Propofol infusion  Airway Management Planned: Natural Airway and Simple Face Mask  Additional Equipment:   Intra-op Plan:   Post-operative Plan:   Informed Consent: I have reviewed the patients History and Physical, chart, labs and discussed the procedure including the risks, benefits and alternatives for the proposed anesthesia with the patient or authorized representative who has indicated  his/her understanding and acceptance.   Dental advisory given  Plan Discussed with: CRNA  Anesthesia Plan Comments:         Anesthesia Quick Evaluation

## 2018-09-03 NOTE — Anesthesia Postprocedure Evaluation (Signed)
Anesthesia Post Note  Patient: Kim Carpenter  Procedure(s) Performed: TRANSESOPHAGEAL ECHOCARDIOGRAM (TEE) (N/A )     Patient location during evaluation: PACU Anesthesia Type: MAC Level of consciousness: awake and alert Pain management: pain level controlled Vital Signs Assessment: post-procedure vital signs reviewed and stable Respiratory status: spontaneous breathing and respiratory function stable Cardiovascular status: stable Postop Assessment: no apparent nausea or vomiting Anesthetic complications: no    Last Vitals:  Vitals:   09/03/18 1520 09/03/18 1530  BP: (!) 159/105   Pulse: 75 86  Resp: 19 15  Temp:    SpO2: 99% 99%    Last Pain:  Vitals:   09/03/18 1530  TempSrc:   PainSc: 0-No pain                 Reshanda Lewey DANIEL

## 2018-09-03 NOTE — Discharge Instructions (Signed)

## 2018-09-03 NOTE — CV Procedure (Signed)
TEE: Under moderate sedation, TEE was performed without complications: LV: Normal. Normal EF. RV: Normal LA: Normal. Left atrial appendage: Normal without thrombus. Normal function. Inter atrial septum is intact without defect. RA: Normal  MV: Mild to moderate MR. No vegetation seen.  TV: Normal Trace TR No vegetation seen.  AV: Normal. No AI or AS. No vegetation seen.  PV: Normal. Trace PI. No vegetation seen.  Catheter tip seen in SVC. No vegetation seen.   Thoracic and ascending aorta: Normal without significant plaque or atheromatous changes.  Deep sedation administered and monitored by CRNA. Patient tolerated the procedure well and there was no complication from deep sedation. Time administered was 25 and procedure ended at 2:45 PM.  Elder Negus, MD [facility] [place] Pager: [phone] Office: [phone] If no answer Cell [phone]  .

## 2018-09-03 NOTE — Transfer of Care (Signed)
Immediate Anesthesia Transfer of Care Note  Patient: Kim Carpenter  Procedure(s) Performed: TRANSESOPHAGEAL ECHOCARDIOGRAM (TEE) (N/A )  Patient Location: Endoscopy Unit  Anesthesia Type:MAC  Level of Consciousness: awake, alert  and oriented  Airway & Oxygen Therapy: Patient Spontanous Breathing and Patient connected to nasal cannula oxygen  Post-op Assessment: Report given to RN, Post -op Vital signs reviewed and stable and Patient moving all extremities  Post vital signs: Reviewed and stable  Last Vitals:  Vitals Value Taken Time  BP 124/83 09/03/2018  2:53 PM  Temp    Pulse 77 09/03/2018  2:58 PM  Resp 22 09/03/2018  2:58 PM  SpO2 100 % 09/03/2018  2:58 PM  Vitals shown include unvalidated device data.  Last Pain:  Vitals:   09/03/18 1453  PainSc: 0-No pain         Complications: No apparent anesthesia complications

## 2018-09-03 NOTE — H&P (Signed)
Kim Carpenter is an 42 y.o. female.   Chief Complaint: Fungemia HPI:   42 y/o female with recent ventilator dependent respiratory failure secondary to HCAP, Pseudomonas ans MSSA sepsis treated with antibiotics, LUE DVT on warfarin, AKI/CKD, blood loss anemia from pelvic hematoma treated with left inferior epigastric artery embolization, hypertension, SLE with hypercoagulable state, antiphospholipid syndrome.   Patient is now at [place], has been decanulated and tolerating nasal canula well.She also has PEG tube in place. She reports to me that she had some abdominal surgery to remove "fungus from her abdomen" a few months ago. She has a dual lumen catheter in Right subclavian vein. Recent blood culture showed yeast growth from aerobic bottle only. Transthoracic echocardiogram did not show any valvular vegetation. Patient is referred to me for TEE evaluation to rule out vegetation or any other signs of endocarditis.   Patient had few Oz of chocolate milk at 8 am. Given her complex history, need for TEE, anesthesia has offered to temporarily intubate the patient for the purpose of TEE.  Labs 08/26/2018: H/H 8.6/25.5. MCV 93. Platelets 296. Glucose 84. BUN/Cr 30/1.3. eGFR 47/57. Na/K 135/5.2 INR 3.0  Echocardiogram 08/15/2018: LVEF 55-60% Mild left atrial enlargement No evidence of valvular vegetation Small pericardial effusion   Past Medical History:  Diagnosis Date  . Anemia 09/03/2018  . Chronic kidney disease   . CKD (chronic kidney disease) stage 3, GFR 30-59 ml/min (HCC) 09/03/2018  . DVT (deep venous thrombosis) ([place]) 09/03/2018   On warfarin  . 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 H/O sepsis 09/03/2018  . SLE (systemic lupus erythematosus related syndrome) ([place]) 09/03/2018  . Sleep apnea     Past Surgical History:  Procedure Laterality Date  . CHOLECYSTECTOMY  2012  . Laporotomy  2019   Details unclear  . PEG TUBE PLACEMENT  2019  . TONSILECTOMY/ADENOIDECTOMY WITH MYRINGOTOMY      Family History  Problem Relation Age of Onset  . Hypertension Mother   . Macular degeneration Mother   . Heart block Father        Pacemaker  . Hypertension Maternal Grandfather   . Heart attack Paternal Grandfather     History reviewed. No pertinent family history. Social History:  reports that she has been smoking cigarettes. She has been smoking about 0.50 packs per day. She has never used smokeless tobacco. Her alcohol and drug histories are not on file.  Allergies: Not on File  No medications prior to admission.   Review of Systems  Constitutional: Positive for fever (Subjective. 3 days ago.). Negative for chills.  HENT: Negative.   Eyes: Negative.   Respiratory: Negative for sputum production and shortness of breath.   Cardiovascular: Negative for chest pain and palpitations.  Gastrointestinal: Negative for abdominal pain and vomiting.  Genitourinary: Negative.   Musculoskeletal:       Generalized weakness  Neurological: Negative.  Negative for dizziness and headaches.  Endo/Heme/Allergies: Does not bruise/bleed easily.       On warfarin for DVT  Psychiatric/Behavioral: Negative.   All other systems reviewed and are negative.   Blood pressure 131/90, pulse 86, resp. rate 19, height 5' 3"  (1.6 m), weight 59 kg, SpO2 99 %. Physical Exam  Nursing note and vitals reviewed. Constitutional: She is oriented to person, place, and time. She appears well-developed.  Chronically ill appearing   HENT:  Head: Normocephalic and atraumatic.  Eyes: Pupils are equal, round, and reactive to light. Conjunctivae are normal.  Neck: Normal range of motion. Neck supple.  No JVD present.  Cardiovascular: Normal rate, regular rhythm, normal heart sounds and intact distal pulses.  No murmur heard. Respiratory: Effort normal and breath sounds normal.  Right subclavian vein dual lumen catheter  GI: Soft. Bowel sounds are normal. There is no tenderness. There is no rebound.  Lower  abdominal midline laparotomy scar. PEG tube in place  Musculoskeletal: She exhibits no edema or tenderness.  Lymphadenopathy:    She has no cervical adenopathy.  Neurological: She is alert and oriented to person, place, and time.  Generalized weakness. No focal neurodeficit  Skin: Skin is warm and dry.  Psychiatric: She has a normal mood and affect.     Assessment: 42 y/o female with recent ventilator dependent respiratory failure secondary to HCAP, Pseudomonas and MSSA sepsis treated with antibiotics, s/p trach now decanulated, s/p PEG,  s/p Rt subclavian vein dual lumen catheter placement, LUE DVT on warfarin, AKI/CKD, blood loss anemia from pelvic hematoma treated with left inferior epigastric artery embolization, hypertension, SLE with hypercoagulable state, antiphospholipid syndrome. Patient now has yeast growth in blood. She is thus referred to undergo TEE to rule put vegetation or any other evidence of endocarditis.  Plan: TEE Appreciate anesthesia assistance. Patient will be intubated for the procedure.   Nigel Mormon, MD 09/03/2018, 10:08 AM  Nigel Mormon, MD [facility] [place] Pager: [phone] Office: [phone] If no answer Cell [phone]

## 2018-09-03 NOTE — Interval H&P Note (Signed)
History and Physical Interval Note:  09/03/2018 10:24 AM  Kim Carpenter  has presented today for surgery, with the diagnosis of QUESTIONABLE ENDOCARDITIS  The various methods of treatment have been discussed with the patient and family. After consideration of risks, benefits and other options for treatment, the patient has consented to  Procedure(s): TRANSESOPHAGEAL ECHOCARDIOGRAM (TEE) (N/A) as a surgical intervention .  The patient's history has been reviewed, patient examined, no change in status, stable for surgery.  I have reviewed the patient's chart and labs.  Questions were answered to the patient's satisfaction.     Donabelle Molden J Chinmay Squier

## 2018-09-04 ENCOUNTER — Encounter (HOSPITAL_COMMUNITY): Payer: Self-pay | Admitting: Cardiology

## 2018-09-09 DIAGNOSIS — M329 Systemic lupus erythematosus, unspecified: Secondary | ICD-10-CM

## 2018-09-09 DIAGNOSIS — Z9981 Dependence on supplemental oxygen: Secondary | ICD-10-CM

## 2018-09-09 DIAGNOSIS — I82409 Acute embolism and thrombosis of unspecified deep veins of unspecified lower extremity: Secondary | ICD-10-CM

## 2018-09-09 DIAGNOSIS — J9620 Acute and chronic respiratory failure, unspecified whether with hypoxia or hypercapnia: Secondary | ICD-10-CM | POA: Diagnosis not present

## 2018-09-10 DIAGNOSIS — M329 Systemic lupus erythematosus, unspecified: Secondary | ICD-10-CM

## 2018-09-10 DIAGNOSIS — J9621 Acute and chronic respiratory failure with hypoxia: Secondary | ICD-10-CM | POA: Diagnosis not present

## 2018-09-10 DIAGNOSIS — Z9981 Dependence on supplemental oxygen: Secondary | ICD-10-CM

## 2018-09-10 DIAGNOSIS — I82409 Acute embolism and thrombosis of unspecified deep veins of unspecified lower extremity: Secondary | ICD-10-CM | POA: Diagnosis not present

## 2018-09-11 DIAGNOSIS — M329 Systemic lupus erythematosus, unspecified: Secondary | ICD-10-CM | POA: Diagnosis not present

## 2018-09-11 DIAGNOSIS — J9620 Acute and chronic respiratory failure, unspecified whether with hypoxia or hypercapnia: Secondary | ICD-10-CM | POA: Diagnosis not present

## 2018-09-11 DIAGNOSIS — Z9981 Dependence on supplemental oxygen: Secondary | ICD-10-CM

## 2018-09-11 DIAGNOSIS — I82409 Acute embolism and thrombosis of unspecified deep veins of unspecified lower extremity: Secondary | ICD-10-CM

## 2018-09-12 DIAGNOSIS — M329 Systemic lupus erythematosus, unspecified: Secondary | ICD-10-CM | POA: Diagnosis not present

## 2018-09-12 DIAGNOSIS — J9620 Acute and chronic respiratory failure, unspecified whether with hypoxia or hypercapnia: Secondary | ICD-10-CM | POA: Diagnosis not present

## 2018-09-12 DIAGNOSIS — Z9981 Dependence on supplemental oxygen: Secondary | ICD-10-CM

## 2018-09-12 DIAGNOSIS — I82409 Acute embolism and thrombosis of unspecified deep veins of unspecified lower extremity: Secondary | ICD-10-CM

## 2018-09-13 DIAGNOSIS — M329 Systemic lupus erythematosus, unspecified: Secondary | ICD-10-CM | POA: Diagnosis not present

## 2018-09-13 DIAGNOSIS — J9620 Acute and chronic respiratory failure, unspecified whether with hypoxia or hypercapnia: Secondary | ICD-10-CM | POA: Diagnosis not present

## 2018-09-13 DIAGNOSIS — Z9981 Dependence on supplemental oxygen: Secondary | ICD-10-CM

## 2018-09-13 DIAGNOSIS — I82409 Acute embolism and thrombosis of unspecified deep veins of unspecified lower extremity: Secondary | ICD-10-CM

## 2018-09-14 DIAGNOSIS — M329 Systemic lupus erythematosus, unspecified: Secondary | ICD-10-CM

## 2018-09-14 DIAGNOSIS — Z9981 Dependence on supplemental oxygen: Secondary | ICD-10-CM | POA: Diagnosis not present

## 2018-09-14 DIAGNOSIS — J9620 Acute and chronic respiratory failure, unspecified whether with hypoxia or hypercapnia: Secondary | ICD-10-CM

## 2018-09-14 DIAGNOSIS — I82409 Acute embolism and thrombosis of unspecified deep veins of unspecified lower extremity: Secondary | ICD-10-CM | POA: Diagnosis not present

## 2018-09-15 DIAGNOSIS — M329 Systemic lupus erythematosus, unspecified: Secondary | ICD-10-CM

## 2018-09-15 DIAGNOSIS — J9620 Acute and chronic respiratory failure, unspecified whether with hypoxia or hypercapnia: Secondary | ICD-10-CM | POA: Diagnosis not present

## 2018-09-15 DIAGNOSIS — I82409 Acute embolism and thrombosis of unspecified deep veins of unspecified lower extremity: Secondary | ICD-10-CM

## 2018-09-15 DIAGNOSIS — Z9981 Dependence on supplemental oxygen: Secondary | ICD-10-CM

## 2018-09-24 ENCOUNTER — Inpatient Hospital Stay (HOSPITAL_COMMUNITY)
Admission: EM | Admit: 2018-09-24 | Discharge: 2018-09-27 | DRG: 682 | Discharge status: Against Medical Advice | Payer: Medicare Other | Attending: Internal Medicine | Admitting: Internal Medicine

## 2018-09-24 ENCOUNTER — Other Ambulatory Visit: Payer: Self-pay

## 2018-09-24 ENCOUNTER — Encounter (HOSPITAL_COMMUNITY): Payer: Self-pay | Admitting: Emergency Medicine

## 2018-09-24 ENCOUNTER — Emergency Department (HOSPITAL_COMMUNITY): Payer: Medicare Other

## 2018-09-24 DIAGNOSIS — Z7952 Long term (current) use of systemic steroids: Secondary | ICD-10-CM | POA: Diagnosis not present

## 2018-09-24 DIAGNOSIS — A0472 Enterocolitis due to Clostridium difficile, not specified as recurrent: Secondary | ICD-10-CM | POA: Diagnosis not present

## 2018-09-24 DIAGNOSIS — Z888 Allergy status to other drugs, medicaments and biological substances status: Secondary | ICD-10-CM

## 2018-09-24 DIAGNOSIS — E785 Hyperlipidemia, unspecified: Secondary | ICD-10-CM | POA: Diagnosis not present

## 2018-09-24 DIAGNOSIS — D631 Anemia in chronic kidney disease: Secondary | ICD-10-CM | POA: Diagnosis present

## 2018-09-24 DIAGNOSIS — M3214 Glomerular disease in systemic lupus erythematosus: Secondary | ICD-10-CM | POA: Diagnosis not present

## 2018-09-24 DIAGNOSIS — J9601 Acute respiratory failure with hypoxia: Secondary | ICD-10-CM | POA: Diagnosis not present

## 2018-09-24 DIAGNOSIS — G4730 Sleep apnea, unspecified: Secondary | ICD-10-CM | POA: Diagnosis not present

## 2018-09-24 DIAGNOSIS — D649 Anemia, unspecified: Secondary | ICD-10-CM

## 2018-09-24 DIAGNOSIS — Z7901 Long term (current) use of anticoagulants: Secondary | ICD-10-CM

## 2018-09-24 DIAGNOSIS — Z79899 Other long term (current) drug therapy: Secondary | ICD-10-CM | POA: Diagnosis not present

## 2018-09-24 DIAGNOSIS — E875 Hyperkalemia: Secondary | ICD-10-CM | POA: Diagnosis present

## 2018-09-24 DIAGNOSIS — I129 Hypertensive chronic kidney disease with stage 1 through stage 4 chronic kidney disease, or unspecified chronic kidney disease: Secondary | ICD-10-CM | POA: Diagnosis not present

## 2018-09-24 DIAGNOSIS — R809 Proteinuria, unspecified: Secondary | ICD-10-CM | POA: Diagnosis not present

## 2018-09-24 DIAGNOSIS — N183 Chronic kidney disease, stage 3 unspecified: Secondary | ICD-10-CM

## 2018-09-24 DIAGNOSIS — R945 Abnormal results of liver function studies: Secondary | ICD-10-CM

## 2018-09-24 DIAGNOSIS — N189 Chronic kidney disease, unspecified: Secondary | ICD-10-CM | POA: Diagnosis present

## 2018-09-24 DIAGNOSIS — Z86718 Personal history of other venous thrombosis and embolism: Secondary | ICD-10-CM | POA: Diagnosis not present

## 2018-09-24 DIAGNOSIS — K219 Gastro-esophageal reflux disease without esophagitis: Secondary | ICD-10-CM | POA: Diagnosis present

## 2018-09-24 DIAGNOSIS — R748 Abnormal levels of other serum enzymes: Secondary | ICD-10-CM

## 2018-09-24 DIAGNOSIS — E872 Acidosis: Secondary | ICD-10-CM | POA: Diagnosis not present

## 2018-09-24 DIAGNOSIS — Z882 Allergy status to sulfonamides status: Secondary | ICD-10-CM

## 2018-09-24 DIAGNOSIS — I82409 Acute embolism and thrombosis of unspecified deep veins of unspecified lower extremity: Secondary | ICD-10-CM | POA: Diagnosis present

## 2018-09-24 DIAGNOSIS — D6861 Antiphospholipid syndrome: Secondary | ICD-10-CM | POA: Diagnosis not present

## 2018-09-24 DIAGNOSIS — F17210 Nicotine dependence, cigarettes, uncomplicated: Secondary | ICD-10-CM | POA: Diagnosis not present

## 2018-09-24 DIAGNOSIS — Z9911 Dependence on respirator [ventilator] status: Secondary | ICD-10-CM

## 2018-09-24 DIAGNOSIS — M329 Systemic lupus erythematosus, unspecified: Secondary | ICD-10-CM | POA: Diagnosis present

## 2018-09-24 DIAGNOSIS — N179 Acute kidney failure, unspecified: Principal | ICD-10-CM | POA: Diagnosis present

## 2018-09-24 DIAGNOSIS — Z885 Allergy status to narcotic agent status: Secondary | ICD-10-CM

## 2018-09-24 DIAGNOSIS — F329 Major depressive disorder, single episode, unspecified: Secondary | ICD-10-CM | POA: Diagnosis not present

## 2018-09-24 DIAGNOSIS — N184 Chronic kidney disease, stage 4 (severe): Secondary | ICD-10-CM | POA: Diagnosis present

## 2018-09-24 DIAGNOSIS — Z91048 Other nonmedicinal substance allergy status: Secondary | ICD-10-CM

## 2018-09-24 DIAGNOSIS — I10 Essential (primary) hypertension: Secondary | ICD-10-CM | POA: Diagnosis present

## 2018-09-24 DIAGNOSIS — R7989 Other specified abnormal findings of blood chemistry: Secondary | ICD-10-CM

## 2018-09-24 DIAGNOSIS — Z8249 Family history of ischemic heart disease and other diseases of the circulatory system: Secondary | ICD-10-CM

## 2018-09-24 DIAGNOSIS — R531 Weakness: Secondary | ICD-10-CM | POA: Diagnosis present

## 2018-09-24 LAB — COMPREHENSIVE METABOLIC PANEL
ALT: 58 U/L — ABNORMAL HIGH (ref 0–44)
AST: 37 U/L (ref 15–41)
Albumin: 3.1 g/dL — ABNORMAL LOW (ref 3.5–5.0)
Alkaline Phosphatase: 553 U/L — ABNORMAL HIGH (ref 38–126)
Anion gap: 11 (ref 5–15)
BUN: 56 mg/dL — ABNORMAL HIGH (ref 6–20)
CO2: 20 mmol/L — ABNORMAL LOW (ref 22–32)
Calcium: 7.8 mg/dL — ABNORMAL LOW (ref 8.9–10.3)
Chloride: 100 mmol/L (ref 98–111)
Creatinine, Ser: 3 mg/dL — ABNORMAL HIGH (ref 0.44–1.00)
GFR calc Af Amer: 21 mL/min — ABNORMAL LOW (ref >60)
GFR calc non Af Amer: 18 mL/min — ABNORMAL LOW (ref >60)
Glucose, Bld: 103 mg/dL — ABNORMAL HIGH (ref 70–99)
Potassium: 6.6 mmol/L (ref 3.5–5.1)
Sodium: 131 mmol/L — ABNORMAL LOW (ref 135–145)
Total Bilirubin: 0.6 mg/dL (ref 0.3–1.2)
Total Protein: 6.7 g/dL (ref 6.5–8.1)

## 2018-09-24 LAB — BLOOD GAS, VENOUS
Acid-base deficit: 6 mmol/L — ABNORMAL HIGH (ref 0.0–2.0)
Bicarbonate: 19.5 mmol/L — ABNORMAL LOW (ref 20.0–28.0)
O2 SAT: 75.8 %
PATIENT TEMPERATURE: 98.6
PCO2 VEN: 41.1 mmHg — AB (ref 44.0–60.0)
pH, Ven: 7.299 (ref 7.250–7.430)
pO2, Ven: 50.2 mmHg — ABNORMAL HIGH (ref 32.0–45.0)

## 2018-09-24 LAB — CBC WITH DIFFERENTIAL/PLATELET
Abs Immature Granulocytes: 0.11 10*3/uL — ABNORMAL HIGH (ref 0.00–0.07)
Basophils Absolute: 0.1 10*3/uL (ref 0.0–0.1)
Basophils Relative: 1 %
Eosinophils Absolute: 0.7 10*3/uL — ABNORMAL HIGH (ref 0.0–0.5)
Eosinophils Relative: 5 %
HCT: 25.4 % — ABNORMAL LOW (ref 36.0–46.0)
Hemoglobin: 8.3 g/dL — ABNORMAL LOW (ref 12.0–15.0)
Immature Granulocytes: 1 %
Lymphocytes Relative: 21 %
Lymphs Abs: 2.6 10*3/uL (ref 0.7–4.0)
MCH: 32 pg (ref 26.0–34.0)
MCHC: 32.7 g/dL (ref 30.0–36.0)
MCV: 98.1 fL (ref 80.0–100.0)
Monocytes Absolute: 0.7 10*3/uL (ref 0.1–1.0)
Monocytes Relative: 6 %
Neutro Abs: 8.5 10*3/uL — ABNORMAL HIGH (ref 1.7–7.7)
Neutrophils Relative %: 66 %
Platelets: 295 10*3/uL (ref 150–400)
RBC: 2.59 MIL/uL — ABNORMAL LOW (ref 3.87–5.11)
RDW: 14.6 % (ref 11.5–15.5)
WBC: 12.7 10*3/uL — ABNORMAL HIGH (ref 4.0–10.5)
nRBC: 0 % (ref 0.0–0.2)

## 2018-09-24 LAB — URINALYSIS, ROUTINE W REFLEX MICROSCOPIC
Bilirubin Urine: NEGATIVE
Glucose, UA: NEGATIVE mg/dL
Ketones, ur: NEGATIVE mg/dL
Nitrite: NEGATIVE
Protein, ur: >=300 mg/dL — AB
Specific Gravity, Urine: 1.012 (ref 1.005–1.030)
pH: 5 (ref 5.0–8.0)

## 2018-09-24 LAB — TYPE AND SCREEN
ABO/RH(D): A NEG
Antibody Screen: NEGATIVE

## 2018-09-24 LAB — I-STAT BETA HCG BLOOD, ED (MC, WL, AP ONLY): I-stat hCG, quantitative: <5 m[IU]/mL (ref <5)

## 2018-09-24 LAB — PROTIME-INR
INR: 1.59
Prothrombin Time: 18.7 seconds — ABNORMAL HIGH (ref 11.4–15.2)

## 2018-09-24 LAB — I-STAT CG4 LACTIC ACID, ED
Lactic Acid, Venous: 1.03 mmol/L (ref 0.5–1.9)
Lactic Acid, Venous: 0.86 mmol/L (ref 0.5–1.9)

## 2018-09-24 MED ORDER — OXYCODONE HCL 5 MG PO TABS: 5.0000 mg | ORAL_TABLET | Freq: Once | ORAL | Status: DC

## 2018-09-24 MED ORDER — SENNA 8.6 MG PO TABS: 1.0000 | ORAL_TABLET | Freq: Two times a day (BID) | ORAL | Status: DC

## 2018-09-24 MED ORDER — POLYETHYLENE GLYCOL 3350 17 G PO PACK: 17.0000 g | PACK | Freq: Every day | ORAL | Status: DC | PRN

## 2018-09-24 MED ORDER — VANCOMYCIN HCL 125 MG PO CAPS: 125.0000 mg | ORAL_CAPSULE | Freq: Four times a day (QID) | ORAL | Status: DC

## 2018-09-24 MED ORDER — ALBUTEROL SULFATE (2.5 MG/3ML) 0.083% IN NEBU: 2.5000 mg | INHALATION_SOLUTION | Freq: Four times a day (QID) | RESPIRATORY_TRACT | Status: DC

## 2018-09-24 MED ORDER — SENNOSIDES-DOCUSATE SODIUM 8.6-50 MG PO TABS
1.0000 | ORAL_TABLET | Freq: Every day | ORAL | Status: DC
  Administered 2018-09-27: 1 via ORAL
  Filled 2018-09-24 (×2): qty 1

## 2018-09-24 MED ORDER — DILTIAZEM HCL 90 MG PO TABS
90.0000 mg | ORAL_TABLET | Freq: Four times a day (QID) | ORAL | Status: DC
  Administered 2018-09-25 – 2018-09-27 (×10): 90 mg via ORAL
  Filled 2018-09-24 (×13): qty 1

## 2018-09-24 MED ORDER — VITAMIN D3 25 MCG (1000 UNIT) PO TABS
1000.0000 [IU] | ORAL_TABLET | Freq: Every day | ORAL | Status: DC
  Administered 2018-09-25 – 2018-09-27 (×3): 1000 [IU] via ORAL
  Filled 2018-09-24 (×3): qty 1

## 2018-09-24 MED ORDER — CALCIUM CARBONATE 1250 (500 CA) MG PO TABS
1.0000 | ORAL_TABLET | Freq: Every day | ORAL | Status: DC
  Administered 2018-09-25 – 2018-09-27 (×3): 500 mg via ORAL
  Filled 2018-09-24 (×3): qty 1

## 2018-09-24 MED ORDER — TRAMADOL HCL 50 MG PO TABS: 50.0000 mg | ORAL_TABLET | Freq: Four times a day (QID) | ORAL | Status: DC | PRN

## 2018-09-24 MED ORDER — MAGNESIUM OXIDE 400 (241.3 MG) MG PO TABS
400.0000 mg | ORAL_TABLET | Freq: Two times a day (BID) | ORAL | Status: DC
  Administered 2018-09-24 – 2018-09-25 (×2): 400 mg via ORAL
  Filled 2018-09-24 (×2): qty 1

## 2018-09-24 MED ORDER — NICOTINE 14 MG/24HR TD PT24
14.0000 mg | MEDICATED_PATCH | Freq: Every day | TRANSDERMAL | Status: DC
  Administered 2018-09-25 – 2018-09-27 (×3): 14 mg via TRANSDERMAL
  Filled 2018-09-24 (×3): qty 1

## 2018-09-24 MED ORDER — SODIUM POLYSTYRENE SULFONATE 15 GM/60ML PO SUSP
15.0000 g | Freq: Once | ORAL | Status: AC
  Administered 2018-09-24: 15 g via ORAL
  Filled 2018-09-24: qty 60

## 2018-09-24 MED ORDER — SODIUM CHLORIDE 0.9 % IV SOLN: 1.0000 g | Freq: Once | INTRAVENOUS | Status: DC

## 2018-09-24 MED ORDER — PREDNISONE 5 MG PO TABS
5.0000 mg | ORAL_TABLET | Freq: Every day | ORAL | Status: DC
  Administered 2018-09-25 – 2018-09-27 (×3): 5 mg via ORAL
  Filled 2018-09-24 (×3): qty 1

## 2018-09-24 MED ORDER — HEPARIN SODIUM (PORCINE) 5000 UNIT/ML IJ SOLN: 5000.0000 [IU] | Freq: Three times a day (TID) | INTRAMUSCULAR | Status: DC

## 2018-09-24 MED ORDER — DULOXETINE HCL 30 MG PO CPEP
60.0000 mg | ORAL_CAPSULE | Freq: Two times a day (BID) | ORAL | Status: DC
  Administered 2018-09-24 – 2018-09-27 (×6): 60 mg via ORAL
  Filled 2018-09-24 (×6): qty 2

## 2018-09-24 MED ORDER — ALBUTEROL SULFATE (2.5 MG/3ML) 0.083% IN NEBU: 2.5000 mg | INHALATION_SOLUTION | Freq: Four times a day (QID) | RESPIRATORY_TRACT | Status: DC | PRN

## 2018-09-24 MED ORDER — CALCIUM CARBONATE 1500 (600 CA) MG PO TABS
1500.0000 mg | ORAL_TABLET | Freq: Every day | ORAL | Status: DC
  Filled 2018-09-24: qty 1

## 2018-09-24 MED ORDER — SODIUM CHLORIDE 0.9 % IV SOLN
INTRAVENOUS | Status: DC
  Administered 2018-09-24 – 2018-09-27 (×4): via INTRAVENOUS

## 2018-09-24 MED ORDER — METOPROLOL TARTRATE 50 MG PO TABS
50.0000 mg | ORAL_TABLET | Freq: Two times a day (BID) | ORAL | Status: DC
  Administered 2018-09-24 – 2018-09-27 (×6): 50 mg via ORAL
  Filled 2018-09-24 (×6): qty 1

## 2018-09-24 MED ORDER — ACETAMINOPHEN 325 MG PO TABS: 650.0000 mg | ORAL_TABLET | Freq: Four times a day (QID) | ORAL | Status: DC | PRN

## 2018-09-24 MED ORDER — ONDANSETRON HCL 4 MG/2ML IJ SOLN: 4.0000 mg | Freq: Four times a day (QID) | INTRAMUSCULAR | Status: DC | PRN

## 2018-09-24 MED ORDER — VANCOMYCIN 50 MG/ML ORAL SOLUTION
125.0000 mg | Freq: Four times a day (QID) | ORAL | Status: DC
  Administered 2018-09-25 – 2018-09-27 (×11): 125 mg via ORAL
  Filled 2018-09-24 (×13): qty 2.5

## 2018-09-24 MED ORDER — ATORVASTATIN CALCIUM 10 MG PO TABS
10.0000 mg | ORAL_TABLET | Freq: Every day | ORAL | Status: DC
  Administered 2018-09-24 – 2018-09-25 (×2): 10 mg via ORAL
  Filled 2018-09-24 (×2): qty 1

## 2018-09-24 MED ORDER — FAMOTIDINE 20 MG PO TABS
20.0000 mg | ORAL_TABLET | Freq: Two times a day (BID) | ORAL | Status: DC
  Administered 2018-09-24 – 2018-09-27 (×6): 20 mg via ORAL
  Filled 2018-09-24 (×6): qty 1

## 2018-09-24 MED ORDER — CALCIUM GLUCONATE-NACL 1-0.675 GM/50ML-% IV SOLN
1.0000 g | INTRAVENOUS | Status: AC
  Administered 2018-09-24: 1000 mg via INTRAVENOUS
  Filled 2018-09-24: qty 50

## 2018-09-24 MED ORDER — DAPSONE 100 MG PO TABS
100.0000 mg | ORAL_TABLET | Freq: Every day | ORAL | Status: DC
  Administered 2018-09-25 – 2018-09-27 (×3): 100 mg via ORAL
  Filled 2018-09-24 (×3): qty 1

## 2018-09-24 MED ORDER — ONDANSETRON HCL 4 MG PO TABS: 4.0000 mg | ORAL_TABLET | Freq: Four times a day (QID) | ORAL | Status: DC | PRN

## 2018-09-24 MED ORDER — CALCIUM GLUCONATE-NACL 1-0.675 GM/50ML-% IV SOLN: 1.0000 g | Freq: Once | INTRAVENOUS | Status: DC

## 2018-09-24 MED ORDER — ACETAMINOPHEN 650 MG RE SUPP: 650.0000 mg | Freq: Four times a day (QID) | RECTAL | Status: DC | PRN

## 2018-09-24 MED ORDER — FERROUS SULFATE 325 (65 FE) MG PO TABS
325.0000 mg | ORAL_TABLET | Freq: Two times a day (BID) | ORAL | Status: DC
  Administered 2018-09-25 – 2018-09-27 (×5): 325 mg via ORAL
  Filled 2018-09-24 (×5): qty 1

## 2018-09-24 MED ORDER — ALBUTEROL SULFATE (2.5 MG/3ML) 0.083% IN NEBU
10.0000 mg | INHALATION_SOLUTION | Freq: Once | RESPIRATORY_TRACT | Status: AC
  Administered 2018-09-24: 10 mg via RESPIRATORY_TRACT
  Filled 2018-09-24: qty 12

## 2018-09-24 MED ORDER — AMITRIPTYLINE HCL 25 MG PO TABS
25.0000 mg | ORAL_TABLET | Freq: Every day | ORAL | Status: DC
  Administered 2018-09-24 – 2018-09-26 (×3): 25 mg via ORAL
  Filled 2018-09-24 (×3): qty 1

## 2018-09-24 NOTE — ED Notes (Signed)
Floor nurses are currently performing a stat room clean. Will move pt to 1509 once notified the room is ready to receive the pt.

## 2018-09-24 NOTE — ED Triage Notes (Addendum)
Per ems-states patient is from [place]-states abnormal labs-patient has extensive medical history-chronic antibiotic use-recent trach removal-elevated potassium, creatine(chronic kidney disease)-CO2, WBC 7.6, 14.4-patient has C-Diff-patient is only complaining of some fatigue and weakness-temp 91.0-states nausea and vomiting has resolved

## 2018-09-24 NOTE — H&P (Addendum)
History and Physical  Kim MoronLaura C Carpenter UJW:[id number] DOB: [birth year] DOA: 09/24/2018  PCP: Jackie Plumsei-Bonsu, George, MD   Patient coming from:[facility], nursing care divions?   Chief Complaint: acting strange   HPI: Kim MoronLaura C Carpenter is a 42 y.o. 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  History obtained from patient and documentation from LTAC.  Patient reports this morning not feeling like her usual self before having an episode of worsening nausea followed by large volume nonbloody nonbilious emesis shortly after lunch.  She denied any abdominal pain, fevers, chills, cough, shortness of breath, chest pain at that time.  [facility] there was some concern that patient may have been taking nonprescribed oxycodone in her room.  Remain documentation reports transfer because patient was not "acting like herself" with concern for sepsis given "elevated CO2".  Patient has a very complicated history she has been at [facility].  She had a prolonged hospital stay with course complicated by SLE flare and worsening kidney function with need for intermittent dialysis and requirement of prednisone and cyclosporine for slowly, concern for nonprescribed narcotic use during hospital stay with multiple episodes of behavior,  acute hypoxic respiratory failure with ICU stay requiring intubation, persistent bacteremia (pseudomonal bacteremia and H CAP with MSSA-discharge at that time of meropenem and dapsone) prompting removal of PermCath after intermittent HD/CRRT, left upper extremity DVT requiring Coumadin, discontinuation of warfarin due to spontaneous large pelvic hematoma extending to rectal sheath requiring IR embolization on 6/13.  She was discharged to [facility] on 6/26 for further ventilator management and aggressive therapies  LTAC Course: Continue ventilatory support with failed extubations several times before undergoing tracheostomy, but eventually was able to be weaned from it and decannulated. She did get PEG tube for nutritional support.  At some point had bowel surgery bowel perforation in the setting of peritonitis. ID helped with several antibiotic regimens.  She underwent R thoracentesis for symptomat R pleural effusion. She also had EGD which showed mild gastritis.  She had recurrent bacteremia with VRE. TTE and TEE were negative for endocarditis. ID saw her on 11/6 and recommended 14 total days of IV antibiotics from the day of 1st negative blood cultures which were daptomycin and ampicillin. This has been completed per documentation provided by LTAC summary. She was also treated for fungemia with IV diflucan.  Her only active antibiotic is oral vancomycin for c diff colitis which the plan was to continue for at least 1 week after discontinuation of previously mentioned antibiotics.  Ultimately for her Lupus the plan was to restart her myfortic once her final blood cultures had resulted    ED Course: She was afebrile with T-max 98.4, normal respiratory rate, normal oxygen saturation and blood pressure peak of 160/104Blood cultures were obtained, lactic acid was within normal limits, on CMP was found to have potassium of 6.6, sodium 131, BUN 56, creatinine of 3.  White count 12.7, hemoglobin 8.3.  UA grossly  unremarkable.  Chest x-ray with no active cardiopulmonary disease.  Patient was given Kayexalate, calcium gluconate for T wave abnormalities and albuterol nebs before [facility] hospital[facility] was called for admission and further management  Review of Systems:As mentioned in the history of present illness.Review of systems are otherwise negative    Past Medical History:  Diagnosis Date  . Anemia 09/03/2018  . Chronic kidney disease   . CKD (chronic kidney disease) stage 3, GFR 30-59 ml/min (HCC) 09/03/2018  . DVT (deep venous thrombosis) (HCC) 09/03/2018   On warfarin  . Essential hypertension 09/03/2018  . 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 H/O sepsis 09/03/2018  . SLE (systemic lupus erythematosus related syndrome) (HCC) 09/03/2018  . Sleep apnea    Past Surgical History:  Procedure Laterality Date  . CHOLECYSTECTOMY  2012  . EMBOLIZATION  2019   Inferior epigastric artery embolization  . Laporotomy  2019   Details unclear  . PEG TUBE PLACEMENT  2019  . TEE WITHOUT CARDIOVERSION N/A 09/03/2018   Procedure: TRANSESOPHAGEAL ECHOCARDIOGRAM (TEE);  Surgeon: Elder Negus, MD;  Location: [facility] ENDOSCOPY;  Service: Cardiovascular;  Laterality: N/A;  . TONSILECTOMY/ADENOIDECTOMY WITH MYRINGOTOMY     Allergies  Allergen Reactions  . Clindamycin/Lincomycin   . Doxycycline   . Levofloxacin   . Minocycline   . Morphine And Related   . Sulfamethoxazole   . Tacrolimus   . Tape    Social History:  reports that she has been smoking cigarettes. She has been smoking about 0.50 packs per day. She has never used smokeless tobacco. She reports that she does not drink alcohol. Her drug history is not on file. Family History  Problem Relation Age of Onset  . Hypertension Mother   . Macular degeneration Mother   . Heart block Father        Pacemaker  . Hypertension Maternal Grandfather   . Heart attack Paternal Grandfather       Prior to Admission medications   Medication Sig Start Date End Date  Taking? Authorizing Provider  amitriptyline (ELAVIL) 25 MG tablet Take 25 mg by mouth at bedtime.   Yes [provider]  atorvastatin (LIPITOR) 10 MG tablet Take 10 mg by mouth daily at 6 PM.   Yes [provider]  calcium carbonate (OSCAL) 1500 (600 Ca) MG TABS tablet Take 1,500 mg by mouth daily with breakfast.   Yes [provider]  cholecalciferol (VITAMIN D3) 25 MCG (1000 UT) tablet Take 1,000 Units by mouth daily.   Yes [provider]  dapsone 100 MG tablet Take 100 mg by mouth daily.   Yes [provider]  diltiazem (CARDIZEM) 90 MG tablet Take 90 mg by mouth 4 (four) times daily.   Yes [provider]  DULoxetine (CYMBALTA) 60 MG capsule Take 60 mg by mouth 2 (two) times daily.   Yes [provider]  famotidine (PEPCID) 20 MG tablet Take 20 mg by mouth 2 (two) times daily.    Yes [provider]  ferrous sulfate 325 (65 FE) MG tablet Take 325 mg by mouth every 12 (twelve) hours.   Yes [provider]  magnesium oxide (MAG-OX) 400 MG tablet Take 400 mg by mouth 2 (two) times daily.   Yes [provider]  metoprolol tartrate (LOPRESSOR) 50 MG tablet Take 50 mg by mouth every 12 (twelve) hours.    Yes [provider]  nicotine (NICODERM CQ - DOSED IN MG/24 HOURS) 14 mg/24hr patch Place 14 mg onto the skin daily.  Yes [provider]  predniSONE (DELTASONE) 5 MG tablet Take 5 mg by mouth daily with breakfast.   Yes [provider]  sennosides-docusate sodium (SENOKOT-S) 8.6-50 MG tablet Take 1 tablet by mouth daily.    Yes [provider]  VANCOMYCIN HCL PO Take 2.5 mLs by mouth every 6 (six) hours. Oral Syringe   Yes [provider]    Physical Exam: BP (!) 143/111   Pulse 100   Temp 98.4 F (36.9 C)   Resp 20   Ht 5\' 3"  (1.6 m)   Wt 63.5 kg   SpO2 97%   BMI 24.80 kg/m   Constitutional looks older than stated age Eyes: EOMI, anicteric, normal  conjunctivae ENMT: poor dentition Cardiovascular: RRR no MRGs, with no peripheral edema Respiratory: Normal respiratory effort on room air, clear breath sounds  Abdomen: Soft,non-tender, well healed surgical scar, normal bowel sounds Skin: No rash ulcers, or lesions. Without skin tenting  Neurologic: Grossly no focal neuro deficit. Psychiatric:Appropriate affect, and mood. Mental status AAOx3          Labs on Admission:  Basic Metabolic Panel: Recent Labs  Lab 09/24/18 1508  NA 131*  K 6.6*  CL 100  CO2 20*  GLUCOSE 103*  BUN 56*  CREATININE 3.00*  CALCIUM 7.8*   Liver Function Tests: Recent Labs  Lab 09/24/18 1508  AST 37  ALT 58*  ALKPHOS 553*  BILITOT 0.6  PROT 6.7  ALBUMIN 3.1*   No results for input(s): LIPASE, AMYLASE in the last 168 hours. No results for input(s): AMMONIA in the last 168 hours. CBC: Recent Labs  Lab 09/24/18 1508  WBC 12.7*  NEUTROABS 8.5*  HGB 8.3*  HCT 25.4*  MCV 98.1  PLT 295   Cardiac Enzymes: No results for input(s): CKTOTAL, CKMB, CKMBINDEX, TROPONINI in the last 168 hours.  BNP (last 3 results) No results for input(s): BNP in the last 8760 hours.  ProBNP (last 3 results) No results for input(s): PROBNP in the last 8760 hours.  CBG: No results for input(s): GLUCAP in the last 168 hours.  Radiological Exams on Admission: Dg Chest 2 View  Result Date: 09/24/2018 CLINICAL DATA:  Weakness and chest discomfort over the past few days. Dyspnea. EXAM: CHEST - 2 VIEW COMPARISON:  None. FINDINGS: Heart and mediastinal contours are within normal limits. Slight elevation of the right hemidiaphragm. The lungs are clear. No pulmonary edema, effusion or pneumothorax. No acute nor suspicious osseous abnormalities. IMPRESSION: No active cardiopulmonary disease. Electronically Signed   By: Tollie Eth M.D.   On: 09/24/2018 16:11    EKG: Independently reviewed. Prolonged PR interval, T wave abnormalities.  Assessment/Plan Present on  Admission: . Hyperkalemia . Anemia . DVT (deep venous thrombosis) (HCC) . Essential hypertension . SLE (systemic lupus erythematosus related syndrome) (HCC) . Renal failure (ARF), acute on chronic (HCC)  Active Problems:   SLE (systemic lupus erythematosus related syndrome) (HCC)   Anemia   CKD (chronic kidney disease), stage III (HCC)   DVT (deep venous thrombosis) (HCC)   Essential hypertension   Hyperkalemia   Renal failure (ARF), acute on chronic (HCC)  AKI on CKD stage III, suspect prerenal etiology.  Likely related to emesis/loss of volume.  CKD related to lupus nephritis.  Baseline creatinine 1.6, elevated to 3 here.  No new medications that could explain - Continue timed maintenance fluids -Repeat CMP in a.m., check urine lytes -Strict I's and O's -Avoid nephrotoxins -Low threshold to consult nephrology if creatinine continues  to worsen especially in light of history of lupus nephritis and possibility of progression of her CKD  Hyperkalemia, unclear etiology.  In setting of worsening AKI.  Patient's concerning medications that could contribute.  EKG showed some acute abnormalities with T waves, no overt peaking, status post Kayexalate Calcium gluconate in the ED as well as albuterol.  UA shows proteinuria limit dysmorphic cells or other concerning findings -We will repeat K this evening to ensure resolution -Continue to monitor on telemetry  Elevated Alkaline Phosphatase, isolated. Otherwise normal bilirun, slightly elevated ALT. Do not suspect biliary involvement - monitor on repeat CMP  C. difficile colitis.  Denies any abdominal pain or worsening diarrhea -Continue oral vancomycin 125 makes 4 times daily (previous regimen) - Monitor output -Enteric precautions  Previously persistent bacteremia and fungemia. Prolonged hospitalization from 5/6-6/26 with discharge to LTAC.  Very complicated course as mentioned above in history.  Patient recently completed course of  daptomycin and ampicillin for persistent bacteremia as well as IV diflucan for fungemia. TEE on 10/29 neg for endocarditis.  At her LTAC prior to admission there was some concern for sepsis again however patient has remained afebrile here and hemodynamically stable with no lactic acidosis and no localizing signs or symptoms of infection. - Monitor blood cultures -Low threshold to start antibiotics if becomes febrile  Lupus, stable.  Per outside documentation plan was to restart Myfortic once all blood cultures remain negative.  Will not start very given actively ruling out infection during observation. -Continue prednisone  Hypertension, slightly elevated -Resume home meds -Monitor  Normocytic chronic anemia, likely anemia of chronic disease related to CKD/lupus.  Hemoglobin stable at baseline -Serial CBC  History of antiphospholipid syndrome.  Previous history of left upper extremity DVT.  No longer on warfarin given significant spontaneous pelvic hematoma requiring IR embolization on last hospitalization 04/2018  -Closely monitor  -SCDs given catastrophic hemorrhaging on prior anticoagulation  Hyperlipidemia, stable -Continue home atorvastatin  Depression, stable -Continue home duloxetine  GERD, stable -Continue home Pepcid  DVT prophylaxis: SCD  Code Status: Full Code, discussed on day of admission   Family Communication: no family at bedside  Disposition Plan: expect resolution of AKI on CKD in less than 2 night stays/ 48 hours. Will observe on telemetry unity with maintenance fluids to treat likely pre renal AKI and ensure resolution of hyperkalemia   Consults called: none   Admission status:Observation to telemetry unit.      Laverna Peace MD [facility]  Pager [phone]  If 7PM-7AM, please contact night-coverage [email or web address] Password [email or web address]  09/24/2018, 10:20 PM

## 2018-09-24 NOTE — ED Notes (Signed)
ED TO INPATIENT HANDOFF REPORT  Name/Age/Gender Kim Carpenter 42 y.o. female  Code Status   Home/SNF/Other Given to floor  Chief Complaint Poss Sepsis  Level of Care/Admitting Diagnosis ED Disposition    ED Disposition Condition [facility]: [place] [[id number]]  Level of Care: Telemetry [5]  Admit to tele based on following criteria: Other see comments  Comments: hyperkalemia, monitor t waves  Diagnosis: Hyperkalemia [509326]  Admitting Physician: Desiree Hane [[id number]]  Attending Physician: Desiree Hane [phone]  PT Class (Do Not Modify): Observation [104]  PT Acc Code (Do Not Modify): Observation [10022]       Medical History Past Medical History:  Diagnosis Date  . Anemia 09/03/2018  . Chronic kidney disease   . CKD (chronic kidney disease) stage 3, GFR 30-59 ml/min (HCC) 09/03/2018  . DVT (deep venous thrombosis) ([place]) 09/03/2018   On warfarin  . Essential hypertension 09/03/2018  . 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 H/O sepsis 09/03/2018  . SLE (systemic lupus erythematosus related syndrome) ([place]) 09/03/2018  . Sleep apnea     Allergies Allergies  Allergen Reactions  . Clindamycin/Lincomycin   . Doxycycline   . Levofloxacin   . Minocycline   . Morphine And Related   . Sulfamethoxazole   . Tacrolimus   . Tape     IV Location/Drains/Wounds Patient Lines/Drains/Airways Status   Active Line/Drains/Airways    Name:   Placement date:   Placement time:   Site:   Days:   Peripheral IV 09/24/18 Left Antecubital   09/24/18    1636    Antecubital   less than 1   Peripheral IV 09/24/18 Right Arm   09/24/18    1637    Arm   less than 1          Labs/Imaging Results for orders placed or performed during the hospital encounter of 09/24/18 (from the past 48 hour(s))  Urinalysis, Routine w reflex microscopic     Status: Abnormal   Collection Time: 09/24/18  2:50 PM  Result Value Ref Range   Color, Urine YELLOW  YELLOW   APPearance HAZY (A) CLEAR   Specific Gravity, Urine 1.012 1.005 - 1.030   pH 5.0 5.0 - 8.0   Glucose, UA NEGATIVE NEGATIVE mg/dL   Hgb urine dipstick SMALL (A) NEGATIVE   Bilirubin Urine NEGATIVE NEGATIVE   Ketones, ur NEGATIVE NEGATIVE mg/dL   Protein, ur >=300 (A) NEGATIVE mg/dL   Nitrite NEGATIVE NEGATIVE   Leukocytes, UA TRACE (A) NEGATIVE   RBC / HPF 0-5 0 - 5 RBC/hpf   WBC, UA 11-20 0 - 5 WBC/hpf   Bacteria, UA RARE (A) NONE SEEN   Squamous Epithelial / LPF 0-5 0 - 5   Mucus PRESENT    Hyaline Casts, UA PRESENT     Comment: Performed at [facility], [place] [address] [place], [place] [address]  Comprehensive metabolic panel     Status: Abnormal   Collection Time: 09/24/18  3:08 PM  Result Value Ref Range   Sodium 131 (L) 135 - 145 mmol/L   Potassium 6.6 (HH) 3.5 - 5.1 mmol/L    Comment: NO VISIBLE HEMOLYSIS CRITICAL RESULT CALLED TO, READ BACK BY AND VERIFIED WITH: S.BINGHAM AT 1608 ON 09/24/18 BY N.THOMPSON    Chloride 100 98 - 111 mmol/L   CO2 20 (L) 22 - 32 mmol/L   Glucose, Bld 103 (H) 70 - 99 mg/dL   BUN 56 (H) 6 -  20 mg/dL   Creatinine, Ser 3.00 (H) 0.44 - 1.00 mg/dL   Calcium 7.8 (L) 8.9 - 10.3 mg/dL   Total Protein 6.7 6.5 - 8.1 g/dL   Albumin 3.1 (L) 3.5 - 5.0 g/dL   AST 37 15 - 41 U/L   ALT 58 (H) 0 - 44 U/L   Alkaline Phosphatase 553 (H) 38 - 126 U/L   Total Bilirubin 0.6 0.3 - 1.2 mg/dL   GFR calc non Af Amer 18 (L) >60 mL/min   GFR calc Af Amer 21 (L) >60 mL/min    Comment: (NOTE) The eGFR has been calculated using the CKD EPI equation. This calculation has not been validated in all clinical situations. eGFR's persistently <60 mL/min signify possible Chronic Kidney Disease.    Anion gap 11 5 - 15    Comment: Performed at [facility], [place] [address], [place], [place] [address]  CBC with Differential     Status: Abnormal   Collection Time: 09/24/18  3:08 PM  Result Value Ref Range   WBC 12.7 (H) 4.0 -  10.5 K/uL   RBC 2.59 (L) 3.87 - 5.11 MIL/uL   Hemoglobin 8.3 (L) 12.0 - 15.0 g/dL   HCT 25.4 (L) 36.0 - 46.0 %   MCV 98.1 80.0 - 100.0 fL   MCH 32.0 26.0 - 34.0 pg   MCHC 32.7 30.0 - 36.0 g/dL   RDW 14.6 11.5 - 15.5 %   Platelets 295 150 - 400 K/uL   nRBC 0.0 0.0 - 0.2 %   Neutrophils Relative % 66 %   Neutro Abs 8.5 (H) 1.7 - 7.7 K/uL   Lymphocytes Relative 21 %   Lymphs Abs 2.6 0.7 - 4.0 K/uL   Monocytes Relative 6 %   Monocytes Absolute 0.7 0.1 - 1.0 K/uL   Eosinophils Relative 5 %   Eosinophils Absolute 0.7 (H) 0.0 - 0.5 K/uL   Basophils Relative 1 %   Basophils Absolute 0.1 0.0 - 0.1 K/uL   Immature Granulocytes 1 %   Abs Immature Granulocytes 0.11 (H) 0.00 - 0.07 K/uL    Comment: Performed at [facility], [place] [address], [place], [place] [address]  I-Stat CG4 Lactic Acid, ED     Status: None   Collection Time: 09/24/18  3:18 PM  Result Value Ref Range   Lactic Acid, Venous 1.03 0.5 - 1.9 mmol/L  Protime-INR     Status: Abnormal   Collection Time: 09/24/18  4:44 PM  Result Value Ref Range   Prothrombin Time 18.7 (H) 11.4 - 15.2 seconds   INR 1.59     Comment: Performed at [facility], [place] [address] [place], [place] [address]  Type and screen [place]     Status: None   Collection Time: 09/24/18  4:44 PM  Result Value Ref Range   ABO/RH(D) A NEG    Antibody Screen NEG    Sample Expiration      09/27/2018 Performed at [facility], [place] [address], [place], [place] [address]   ABO/Rh     Status: None (Preliminary result)   Collection Time: 09/24/18  4:44 PM  Result Value Ref Range   ABO/RH(D)      A NEG Performed at [facility], [place] [address], [place], [place] [address]   I-Stat Beta hCG blood, ED [facility], [facility] only)     Status: None   Collection Time: 09/24/18  4:59 PM  Result  Value Ref Range   I-stat hCG, quantitative <5.0 <5 mIU/mL   Comment 3             Comment:   GEST. AGE      CONC.  (mIU/mL)   <=1 WEEK        5 - 50     2 WEEKS       50 - 500     3 WEEKS       100 - 10,000     4 WEEKS     1,000 - 30,000        FEMALE AND NON-PREGNANT FEMALE:     LESS THAN 5 mIU/mL   I-Stat CG4 Lactic Acid, ED     Status: None   Collection Time: 09/24/18  5:02 PM  Result Value Ref Range   Lactic Acid, Venous 0.86 0.5 - 1.9 mmol/L  Blood gas, venous     Status: Abnormal   Collection Time: 09/24/18  5:31 PM  Result Value Ref Range   pH, Ven 7.299 7.250 - 7.430   pCO2, Ven 41.1 (L) 44.0 - 60.0 mmHg   pO2, Ven 50.2 (H) 32.0 - 45.0 mmHg   Bicarbonate 19.5 (L) 20.0 - 28.0 mmol/L   Acid-base deficit 6.0 (H) 0.0 - 2.0 mmol/L   O2 Saturation 75.8 %   Patient temperature 98.6    Collection site DRAWN BY RN    Drawn by DRAWN BY RN    Sample type VEIN     Comment: Performed at [facility], [place] [address], [place], [place] [address]   Dg Chest 2 View  Result Date: 09/24/2018 CLINICAL DATA:  Weakness and chest discomfort over the past few days. Dyspnea. EXAM: CHEST - 2 VIEW COMPARISON:  None. FINDINGS: Heart and mediastinal contours are within normal limits. Slight elevation of the right hemidiaphragm. The lungs are clear. No pulmonary edema, effusion or pneumothorax. No acute nor suspicious osseous abnormalities. IMPRESSION: No active cardiopulmonary disease. Electronically Signed   By: Ashley Royalty M.D.   On: 09/24/2018 16:11    Pending Labs Unresulted Labs (From admission, onward)    Start     Ordered   09/24/18 1547  Urine culture  ONCE - STAT,   STAT     09/24/18 1546   09/24/18 1523  Blood Culture (routine x 2)  BLOOD CULTURE X 2,   STAT     09/24/18 1524   Signed and Held  HIV antibody (Routine Testing)  Once,   R     Signed and Held   Signed and Held  Comprehensive metabolic panel  Tomorrow morning,   R     Signed and Held   Signed and Held  CBC  Tomorrow morning,   R     Signed and Held   Signed and Held  Protime-INR   Tomorrow morning,   R     Signed and Held   Signed and Held  Sodium, urine, random  Once,   R     Signed and Held   Signed and Held  Creatinine, urine, random  Once,   R     Signed and Held          Vitals/Pain Today's Vitals   09/24/18 1700 09/24/18 1730 09/24/18 1830 09/24/18 2000  BP: (!) 155/99 (!) 153/106 (!) 168/104 (!) 144/94  Pulse: 76 80 93 89  Resp: 17 12 19 20   Temp:      TempSrc:  SpO2: 95% 97% 94% 98%  Weight:      Height:      PainSc:        Isolation Precautions Contact and Enteric precautions (UV disinfection)  Medications Medications  sodium polystyrene (KAYEXALATE) 15 GM/60ML suspension 15 g (15 g Oral Given 09/24/18 1749)  calcium gluconate 1 g/ 50 mL sodium chloride IVPB (0 g Intravenous Stopped 09/24/18 2002)  albuterol (PROVENTIL) (2.5 MG/3ML) 0.083% nebulizer solution 10 mg (10 mg Nebulization Given 09/24/18 1713)    Mobility walks

## 2018-09-24 NOTE — ED Notes (Signed)
She tells me she comes [facility] for reasons not quite clear to her. She does mention being a patient there since April of this year. She does mention a recent cough, although no cough is noted thus far this E.D. Visit. She is awake, alert and oriented x 4 with clear speech. She has a P.E.G. Tube in her upper abd.

## 2018-09-24 NOTE — ED Provider Notes (Signed)
[facility]   CSN: [id number] Arrival date & time: 09/24/18  1404     History   Chief Complaint Chief Complaint  Patient presents with  . Weakness    HPI Kim Carpenter is a 42 y.o. female.  HPI  Patient is a 42 year old female with history of SLE, CKD stage IV, DVT, currently on warfarin, anemia of chronic disease, and hypertension presenting for abnormal labs.  She presents from [facility].  Patient has resided in [facility] since June 2019 after prolonged hospitalization at [facility].  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  She does have a PEG tube, however it is not been used in approximately 1 month, patient is tolerating p.o.  Patient reports that she was told by the facility that she was "acting strange" this morning they were concerned about AMS, however she reports that she did not experience this.  She does report that she vomited once today, nonbilious and nonbloody.  She denies any diarrhea.  Per [facility], she is currently being treated with oral vancomycin for C. difficile.  Patient denies any fevers, chills, chest pain, shortness of breath, rashes.  Patient denies any abdominal pain.  She does report some urinary pressure and some dysuria over the past couple days, as well as seeing some "blood" when she will wipe, bleeding is from a urinary source.  She reports she has had some low back pain consistent with her prior, but no increase.  Patient denies any lower extremity weakness or numbness, saddle anesthesia, loss of bowel or bladder control.   Spoke with charge nurse, Ian Malkin, and patient's RN Morrie Sheldon at [facility].  They discussed that patient was slightly altered this morning, and they found oxycodones in her room that were not prescribed to her.  This have resolved when presenting to the emergency department.  They  reports that patient also stated that she wanted to leave AMA.  Past Medical History:  Diagnosis Date  . Anemia 09/03/2018  . Chronic kidney disease   . CKD (chronic kidney disease) stage 3, GFR 30-59 ml/min (HCC) 09/03/2018  . DVT (deep venous thrombosis) (HCC) 09/03/2018   On warfarin  . Essential hypertension 09/03/2018  . 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 H/O sepsis 09/03/2018  . SLE (systemic lupus erythematosus related syndrome) (HCC) 09/03/2018  . Sleep apnea     Patient Active Problem List   Diagnosis Date Noted  . SLE (systemic lupus erythematosus related syndrome) (HCC) 09/03/2018  . 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 H/O sepsis 09/03/2018  . Anemia 09/03/2018  . CKD (chronic kidney disease) stage 3, GFR 30-59 ml/min (HCC) 09/03/2018  . DVT (deep venous thrombosis) (HCC) 09/03/2018  . Tobacco abuse 09/03/2018  . Essential hypertension 09/03/2018    Past Surgical History:  Procedure Laterality Date  . CHOLECYSTECTOMY  2012  . EMBOLIZATION  2019   Inferior epigastric artery embolization  . Laporotomy  2019   Details unclear  . PEG TUBE PLACEMENT  2019  . TEE WITHOUT CARDIOVERSION N/A 09/03/2018   Procedure: TRANSESOPHAGEAL ECHOCARDIOGRAM (TEE);  Surgeon: Elder Negus, MD;  Location: [facility] ENDOSCOPY;  Service: Cardiovascular;  Laterality: N/A;  . TONSILECTOMY/ADENOIDECTOMY WITH MYRINGOTOMY       OB History   None      Home Medications    Prior to Admission medications   Medication Sig Start Date End Date Taking? Authorizing Provider  acetaminophen (TYLENOL) 325 MG suppository Place 650 mg rectally every 6 (six) hours as needed.    [provider]  amitriptyline (ELAVIL) 25 MG tablet Take 25 mg by mouth at bedtime.    [provider]  atorvastatin (LIPITOR) 10 MG tablet Take 10 mg by mouth daily at 6 PM.    [provider]  calcium-vitamin D (OSCAL WITH D) 500-200 MG-UNIT tablet Take 1 tablet by mouth.    [provider]  cholecalciferol  (VITAMIN D) 400 units TABS tablet Take 1,000 Units by mouth daily.    [provider]  dapsone 100 MG tablet Take 100 mg by mouth daily.    [provider]  daptomycin (CUBICIN) IVPB Inject 500 mg into the vein daily.    [provider]  diltiazem (CARDIZEM) 90 MG tablet Take 90 mg by mouth 4 (four) times daily.    [provider]  DULoxetine (CYMBALTA) 60 MG capsule Take 60 mg by mouth 2 (two) times daily.    [provider]  famotidine (PEPCID) 20 MG tablet Place 20 mg into feeding tube 2 (two) times daily.    [provider]  ferrous sulfate 325 (65 FE) MG tablet Take 325 mg by mouth every 12 (twelve) hours.    [provider]  fluconazole (DIFLUCAN) IVPB Inject 200 mg into the vein daily.    [provider]  HYDROmorphone (DILAUDID) 2 MG tablet Take by mouth every 12 (twelve) hours as needed for severe pain.    [provider]  ipratropium-albuterol (DUONEB) 0.5-2.5 (3) MG/3ML SOLN Inhale 3 mLs into the lungs every 6 (six) hours as needed.    [provider]  LORazepam (ATIVAN) 0.5 MG tablet Take 0.5 mg by mouth every 6 (six) hours as needed for anxiety.    [provider]  magnesium oxide (MAG-OX) 400 MG tablet Take 400 mg by mouth 2 (two) times daily.    [provider]  Melatonin 3 MG TABS Take 6 mg by mouth at bedtime.    [provider]  meropenem (MERREM) IVPB Inject 2 g into the vein every 8 (eight) hours.    [provider]  metoprolol tartrate (LOPRESSOR) 50 MG tablet Take 50 mg by mouth 2 (two) times daily.    [provider]  nicotine (NICODERM CQ - DOSED IN MG/24 HOURS) 14 mg/24hr patch Place 14 mg onto the skin daily.    [provider]  ondansetron (ZOFRAN) 4 MG tablet Take 4 mg by mouth every 6 (six) hours as needed for nausea or vomiting.    [provider]  predniSONE (DELTASONE) 5 MG tablet Take 5 mg by mouth daily with  breakfast.    [provider]  sennosides-docusate sodium (SENOKOT-S) 8.6-50 MG tablet Take 1 tablet by mouth at bedtime as needed for constipation.    [provider]  vancomycin (VANCOCIN) 125 MG capsule Take 125 mg by mouth every 6 (six) hours.    [provider]  warfarin (COUMADIN) 5 MG tablet Take 5 mg by mouth daily.    [provider]  zinc oxide (BALMEX) 11.3 % CREA cream Apply 1 application topically daily.    [provider]    Family History Family History  Problem Relation Age of Onset  . Hypertension Mother   . Macular degeneration Mother   . Heart block Father        Pacemaker  . Hypertension Maternal Grandfather   . Heart attack Paternal Grandfather  Social History Social History   Tobacco Use  . Smoking status: Current Every Day Smoker    Packs/day: 0.50    Types: Cigarettes  . Smokeless tobacco: Never Used  Substance Use Topics  . Alcohol use: Never    Frequency: Never  . Drug use: Not on file     Allergies   Clindamycin/lincomycin; Doxycycline; Levofloxacin; Minocycline; Morphine and related; Sulfamethoxazole; Tacrolimus; and Tape   Review of Systems Review of Systems  Constitutional: Negative for chills and fever.  HENT: Negative for congestion and sore throat.   Eyes: Negative for visual disturbance.  Respiratory: Negative for cough, chest tightness and shortness of breath.   Cardiovascular: Negative for chest pain, palpitations and leg swelling.  Gastrointestinal: Positive for vomiting. Negative for abdominal pain, constipation, diarrhea and nausea.  Genitourinary: Positive for hematuria. Negative for dysuria and flank pain.  Musculoskeletal: Positive for back pain. Negative for myalgias.  Skin: Negative for rash.  Neurological: Negative for dizziness, syncope, light-headedness and headaches.     Physical Exam Updated Vital Signs BP (!) 142/92 (BP Location: Left Arm)   Pulse 74   Temp 98.4 F  (36.9 C) (Rectal)   Resp 18   Ht 5\' 3"  (1.6 m)   Wt 63.5 kg   SpO2 96%   BMI 24.80 kg/m   Physical Exam  Constitutional: She appears well-developed and well-nourished. No distress.  Chronically ill.  Appearing older than stated age.  HENT:  Head: Normocephalic and atraumatic.  Mouth/Throat: Oropharynx is clear and moist.  Eyes: Pupils are equal, round, and reactive to light. Conjunctivae and EOM are normal. No scleral icterus.  Neck: Normal range of motion. Neck supple.  Cardiovascular: Normal rate, regular rhythm, S1 normal and S2 normal.  No murmur heard. Pulmonary/Chest: Effort normal. She has no wheezes. She has rales.  Coarse crackles in bilateral lung bases.  Abdominal: Soft. She exhibits no distension. There is no tenderness. There is no guarding.  Well-healed, midline ventral surgical scar.  PEG tube in place.  Musculoskeletal: Normal range of motion. She exhibits no edema or deformity.  Strength 5 out of 5 in bilateral lower extremities with hip flexion, and extension, plantarflexion and dorsiflexion.  Lymphadenopathy:    She has no cervical adenopathy.  Neurological: She is alert.  Cranial nerves grossly intact. Patient moves extremities symmetrically and with good coordination.  Skin: Skin is warm and dry. No rash noted. No erythema.  Psychiatric: She has a normal mood and affect. Her behavior is normal. Judgment and thought content normal.  Nursing note and vitals reviewed.    ED Treatments / Results  Labs (all labs ordered are listed, but only abnormal results are displayed) Labs Reviewed  COMPREHENSIVE METABOLIC PANEL - Abnormal; Notable for the following components:      Result Value   Sodium 131 (*)    Potassium 6.6 (*)    CO2 20 (*)    Glucose, Bld 103 (*)    BUN 56 (*)    Creatinine, Ser 3.00 (*)    Calcium 7.8 (*)    Albumin 3.1 (*)    ALT 58 (*)    Alkaline Phosphatase 553 (*)    GFR calc non Af Amer 18 (*)    GFR calc Af Amer 21 (*)    All  other components within normal limits  CBC WITH DIFFERENTIAL/PLATELET - Abnormal; Notable for the following components:   WBC 12.7 (*)    RBC 2.59 (*)    Hemoglobin 8.3 (*)  HCT 25.4 (*)    Neutro Abs 8.5 (*)    Eosinophils Absolute 0.7 (*)    Abs Immature Granulocytes 0.11 (*)    All other components within normal limits  URINALYSIS, ROUTINE W REFLEX MICROSCOPIC - Abnormal; Notable for the following components:   APPearance HAZY (*)    Hgb urine dipstick SMALL (*)    Protein, ur >=300 (*)    Leukocytes, UA TRACE (*)    Bacteria, UA RARE (*)    All other components within normal limits  CULTURE, BLOOD (ROUTINE X 2)  CULTURE, BLOOD (ROUTINE X 2)  URINE CULTURE  BLOOD GAS, VENOUS  PROTIME-INR  I-STAT CG4 LACTIC ACID, ED  I-STAT BETA HCG BLOOD, ED ([facility], [facility], [facility] ONLY)  I-STAT CG4 LACTIC ACID, ED  TYPE AND SCREEN    EKG EKG Interpretation  Date/Time:  Tuesday September 24 2018 14:21:17 EST Ventricular Rate:  71 PR Interval:    QRS Duration: 96 QT Interval:  432 QTC Calculation: 470 R Axis:   1 Text Interpretation:  Sinus rhythm Prolonged PR interval Nonspecific T abnormalities, lateral leads Suggestive of hyperkalemia Confirmed by Raeford Razor [id number]) on 09/24/2018 3:47:27 PM   Radiology Dg Chest 2 View  Result Date: 09/24/2018 CLINICAL DATA:  Weakness and chest discomfort over the past few days. Dyspnea. EXAM: CHEST - 2 VIEW COMPARISON:  None. FINDINGS: Heart and mediastinal contours are within normal limits. Slight elevation of the right hemidiaphragm. The lungs are clear. No pulmonary edema, effusion or pneumothorax. No acute nor suspicious osseous abnormalities. IMPRESSION: No active cardiopulmonary disease. Electronically Signed   By: Tollie Eth M.D.   On: 09/24/2018 16:11    Procedures Procedures (including critical care time)  CRITICAL CARE Performed by: Elisha Ponder   Total critical care time: 35 minutes  Critical care time was exclusive of separately  billable procedures and treating other patients.  Critical care was necessary to treat or prevent imminent or life-threatening deterioration.  Critical care was time spent personally by me on the following activities: development of treatment plan with patient and/or surrogate as well as nursing, discussions with consultants, evaluation of patient's response to treatment, examination of patient, obtaining history from patient or surrogate, ordering and performing treatments and interventions, ordering and review of laboratory studies, ordering and review of radiographic studies, pulse oximetry and re-evaluation of patient's condition.   Medications Ordered in ED Medications  sodium polystyrene (KAYEXALATE) 15 GM/60ML suspension 15 g (has no administration in time range)  calcium gluconate 1 g/ 50 mL sodium chloride IVPB (has no administration in time range)  oxyCODONE (Oxy IR/ROXICODONE) immediate release tablet 5 mg (has no administration in time range)     Initial Impression / Assessment and Plan / ED Course  I have reviewed the triage vital signs and the nursing notes.  Pertinent labs & imaging results that were available during my care of the patient were reviewed by me and considered in my medical decision making (see chart for details).  Clinical Course as of Sep 24 1858  Tue Sep 24, 2018  1620 Spoke with pharmacy. Kayexalate ordered. Calcium ordered.   Potassium(!!): 6.6 [AM]  1741 Discuss admission with patient.  She states that she would prefer to be admitted at [facility] and does not wish to be transferred to [facility] at this time if possible.   [AM]  1826 Spoke with Dr. Mikeal Hawthorne of [facility], with the patient.  I appreciate his involvement in the care of this patient.   [  AM]    Clinical Course User Index [AM] Elisha PonderMurray, Teandre Hamre B, PA-C    Patient is nontoxic-appearing, hemodynamically stable, and in no acute distress.  Patient with multiple chronic and high  risk medical conditions as well as high risk medications.  Patient is chronically immunosuppressed due to her SLE.  Patient presents with abnormal labs today, most concerning the potassium of 6.6.  She did have evidence of peaked T waves, and calcium was initiated.  Patient is receiving albuterol and Kayexalate in the emergency department.  Patient's anemia appears stable.  Patient has elevated alkaline phosphatase at  554, however has no other signs of biliary obstruction.  Normal bilirubin.  Prior liver enzymes in chart from 2016, and were normal.  No abdominal tenderness, and no fever.  Patient does have leukocytosis to 12.7 with left shift, however there are no focal signs of infection presently.  Blood cultures are ordered on patient.  Given patient's rheumatologic and nephrology history at [facility], I discussed with the patient prospect of admitting her at that hospital where she has previous care.  She states that she does not wish to be [facility] at [facility].  She states that she only wants [place] at this time.  Patient to admitted per [facility]. I appreciate their involvement in the care of this patient.   Final Clinical Impressions(s) / ED Diagnoses   Final diagnoses:  Hyperkalemia  Elevated alkaline phosphatase level  Anemia, unspecified type  Chronic kidney disease (CKD), stage IV (severe) [facility](HCC)    ED Discharge Orders    None       Delia ChimesMurray, Haile Bosler B, PA-C 09/24/18 Kristen Cardinal2000    Kohut, Stephen, MD 09/24/18 2106

## 2018-09-24 NOTE — ED Notes (Signed)
Bed: WA07 Expected date:  Expected time:  Means of arrival:  Comments: EMS-sepsis

## 2018-09-25 DIAGNOSIS — Z7952 Long term (current) use of systemic steroids: Secondary | ICD-10-CM | POA: Diagnosis not present

## 2018-09-25 DIAGNOSIS — N183 Chronic kidney disease, stage 3 (moderate): Secondary | ICD-10-CM | POA: Diagnosis not present

## 2018-09-25 DIAGNOSIS — D6861 Antiphospholipid syndrome: Secondary | ICD-10-CM | POA: Diagnosis not present

## 2018-09-25 DIAGNOSIS — I10 Essential (primary) hypertension: Secondary | ICD-10-CM

## 2018-09-25 DIAGNOSIS — I82629 Acute embolism and thrombosis of deep veins of unspecified upper extremity: Secondary | ICD-10-CM | POA: Diagnosis not present

## 2018-09-25 DIAGNOSIS — N184 Chronic kidney disease, stage 4 (severe): Secondary | ICD-10-CM | POA: Diagnosis not present

## 2018-09-25 DIAGNOSIS — G4730 Sleep apnea, unspecified: Secondary | ICD-10-CM | POA: Diagnosis not present

## 2018-09-25 DIAGNOSIS — K219 Gastro-esophageal reflux disease without esophagitis: Secondary | ICD-10-CM | POA: Diagnosis not present

## 2018-09-25 DIAGNOSIS — Z9911 Dependence on respirator [ventilator] status: Secondary | ICD-10-CM | POA: Diagnosis not present

## 2018-09-25 DIAGNOSIS — D631 Anemia in chronic kidney disease: Secondary | ICD-10-CM | POA: Diagnosis not present

## 2018-09-25 DIAGNOSIS — A0472 Enterocolitis due to Clostridium difficile, not specified as recurrent: Secondary | ICD-10-CM

## 2018-09-25 DIAGNOSIS — Z86718 Personal history of other venous thrombosis and embolism: Secondary | ICD-10-CM | POA: Diagnosis not present

## 2018-09-25 DIAGNOSIS — N179 Acute kidney failure, unspecified: Secondary | ICD-10-CM | POA: Diagnosis not present

## 2018-09-25 DIAGNOSIS — E875 Hyperkalemia: Secondary | ICD-10-CM

## 2018-09-25 DIAGNOSIS — R748 Abnormal levels of other serum enzymes: Secondary | ICD-10-CM | POA: Diagnosis not present

## 2018-09-25 DIAGNOSIS — I129 Hypertensive chronic kidney disease with stage 1 through stage 4 chronic kidney disease, or unspecified chronic kidney disease: Secondary | ICD-10-CM | POA: Diagnosis not present

## 2018-09-25 DIAGNOSIS — E872 Acidosis: Secondary | ICD-10-CM | POA: Diagnosis not present

## 2018-09-25 DIAGNOSIS — Z79899 Other long term (current) drug therapy: Secondary | ICD-10-CM | POA: Diagnosis not present

## 2018-09-25 DIAGNOSIS — J9601 Acute respiratory failure with hypoxia: Secondary | ICD-10-CM | POA: Diagnosis not present

## 2018-09-25 DIAGNOSIS — M329 Systemic lupus erythematosus, unspecified: Secondary | ICD-10-CM | POA: Diagnosis not present

## 2018-09-25 DIAGNOSIS — E785 Hyperlipidemia, unspecified: Secondary | ICD-10-CM | POA: Diagnosis not present

## 2018-09-25 DIAGNOSIS — F329 Major depressive disorder, single episode, unspecified: Secondary | ICD-10-CM | POA: Diagnosis not present

## 2018-09-25 DIAGNOSIS — F17210 Nicotine dependence, cigarettes, uncomplicated: Secondary | ICD-10-CM | POA: Diagnosis not present

## 2018-09-25 DIAGNOSIS — R809 Proteinuria, unspecified: Secondary | ICD-10-CM | POA: Diagnosis not present

## 2018-09-25 DIAGNOSIS — M3214 Glomerular disease in systemic lupus erythematosus: Secondary | ICD-10-CM | POA: Diagnosis not present

## 2018-09-25 DIAGNOSIS — Z7901 Long term (current) use of anticoagulants: Secondary | ICD-10-CM | POA: Diagnosis not present

## 2018-09-25 LAB — DIFFERENTIAL
Basophils Absolute: 0.1 10*3/uL (ref 0.0–0.1)
Basophils Relative: 1 %
EOS ABS: 0.6 10*3/uL — AB (ref 0.0–0.5)
EOS PCT: 8 %
LYMPHS ABS: 1.4 10*3/uL (ref 0.7–4.0)
LYMPHS PCT: 17 %
MONO ABS: 0.7 10*3/uL (ref 0.1–1.0)
Monocytes Relative: 8 %
NEUTROS PCT: 66 %
Neutro Abs: 5.4 10*3/uL (ref 1.7–7.7)

## 2018-09-25 LAB — COMPREHENSIVE METABOLIC PANEL
ALT: 47 U/L — AB (ref 0–44)
AST: 33 U/L (ref 15–41)
Albumin: 2.6 g/dL — ABNORMAL LOW (ref 3.5–5.0)
Alkaline Phosphatase: 461 U/L — ABNORMAL HIGH (ref 38–126)
Anion gap: 6 (ref 5–15)
BUN: 47 mg/dL — AB (ref 6–20)
CHLORIDE: 107 mmol/L (ref 98–111)
CO2: 22 mmol/L (ref 22–32)
CREATININE: 2.37 mg/dL — AB (ref 0.44–1.00)
Calcium: 8 mg/dL — ABNORMAL LOW (ref 8.9–10.3)
GFR calc Af Amer: 28 mL/min — ABNORMAL LOW (ref >60)
GFR calc non Af Amer: 24 mL/min — ABNORMAL LOW (ref >60)
Glucose, Bld: 89 mg/dL (ref 70–99)
POTASSIUM: 5.2 mmol/L — AB (ref 3.5–5.1)
Sodium: 135 mmol/L (ref 135–145)
Total Bilirubin: 0.6 mg/dL (ref 0.3–1.2)
Total Protein: 5.8 g/dL — ABNORMAL LOW (ref 6.5–8.1)

## 2018-09-25 LAB — CBC
HEMATOCRIT: 23.3 % — AB (ref 36.0–46.0)
HEMOGLOBIN: 7.5 g/dL — AB (ref 12.0–15.0)
MCH: 31.5 pg (ref 26.0–34.0)
MCHC: 32.2 g/dL (ref 30.0–36.0)
MCV: 97.9 fL (ref 80.0–100.0)
Platelets: 283 10*3/uL (ref 150–400)
RBC: 2.38 MIL/uL — ABNORMAL LOW (ref 3.87–5.11)
RDW: 14.5 % (ref 11.5–15.5)
WBC: 7.7 10*3/uL (ref 4.0–10.5)
nRBC: 0 % (ref 0.0–0.2)

## 2018-09-25 LAB — MRSA PCR SCREENING: MRSA BY PCR: NEGATIVE

## 2018-09-25 LAB — POTASSIUM: Potassium: 5.1 mmol/L (ref 3.5–5.1)

## 2018-09-25 LAB — MAGNESIUM: Magnesium: 2.7 mg/dL — ABNORMAL HIGH (ref 1.7–2.4)

## 2018-09-25 LAB — PROTIME-INR
INR: 1.6
Prothrombin Time: 18.8 seconds — ABNORMAL HIGH (ref 11.4–15.2)

## 2018-09-25 LAB — SODIUM, URINE, RANDOM: SODIUM UR: 78 mmol/L

## 2018-09-25 LAB — CREATININE, URINE, RANDOM: Creatinine, Urine: 70.3 mg/dL

## 2018-09-25 LAB — GLUCOSE, CAPILLARY: GLUCOSE-CAPILLARY: 84 mg/dL (ref 70–99)

## 2018-09-25 LAB — PHOSPHORUS: PHOSPHORUS: 6.8 mg/dL — AB (ref 2.5–4.6)

## 2018-09-25 LAB — ABO/RH: ABO/RH(D): A NEG

## 2018-09-25 MED ORDER — SODIUM POLYSTYRENE SULFONATE 15 GM/60ML PO SUSP
15.0000 g | Freq: Once | ORAL | Status: AC
  Administered 2018-09-25: 15 g via ORAL
  Filled 2018-09-25: qty 60

## 2018-09-25 NOTE — Progress Notes (Signed)
PROGRESS NOTE    Kim Carpenter  ZOX:[id number] DOB: 11-May-1976 DOA: 09/24/2018 PCP: Jackie Plum, MD   Brief Narrative:  HPI per Dr. Roberto Scales on 09/24/18 HPI: Kim Carpenter is a 42 y.o. 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  History obtained from patient and documentation from LTAC.  Patient reports this morning not feeling like her usual self before having an episode of worsening nausea followed by large volume nonbloody nonbilious emesis shortly after lunch.  She denied any abdominal pain, fevers, chills, cough, shortness of breath, chest pain at that time.  Per documentation at [facility] there was some concern that patient may have been taking nonprescribed oxycodone in her room.  Remain documentation reports transfer because patient was not "acting like herself" with concern for sepsis given "elevated CO2".  Patient has a very complicated history she has been at [facility] since discharge from [facility] 05/01/2018.  She had a prolonged hospital stay with course complicated by SLE flare and worsening kidney function with need for intermittent dialysis and requirement of prednisone and cyclosporine for slowly, concern for nonprescribed narcotic use during hospital stay with multiple episodes of behavior, acute hypoxic respiratory failure with ICU  stay requiring intubation, persistent bacteremia (pseudomonal bacteremia and H CAP with MSSA-discharge at that time of meropenem and dapsone) prompting removal of PermCath after intermittent HD/CRRT, left upper extremity DVT requiring Coumadin, discontinuation of warfarin due to spontaneous large pelvic hematoma extending to rectal sheath requiring IR embolization on 6/13.  She was discharged to [facility] on 6/26 for further ventilator management and aggressive therapies  LTAC Course: Continue ventilatory support with failed extubations several times before undergoing tracheostomy, but eventually was able to be weaned from it and decannulated. She did get PEG tube for nutritional support.  At some point had bowel surgery bowel perforation in the setting of peritonitis. ID helped with several antibiotic regimens.  She underwent R thoracentesis for symptomat R pleural effusion. She also had EGD which showed mild gastritis.  She had recurrent bacteremia with VRE. TTE and TEE were negative for endocarditis. ID saw her on 11/6 and recommended 14 total days of IV antibiotics from the day of 1st negative blood cultures which were daptomycin and ampicillin. This has been completed per documentation provided by LTAC summary. She was also treated for fungemia with IV diflucan.  Her only active antibiotic is oral vancomycin for c diff colitis which the plan was to continue for at least 1 week after discontinuation of previously mentioned antibiotics.  Ultimately for her Lupus the plan was to restart her myfortic once her final blood cultures had resulted    ED Course: She was afebrile with T-max 98.4, normal respiratory rate, normal oxygen saturation and blood pressure peak of 160/104Blood cultures were obtained, lactic acid was within normal limits, on CMP was found to have potassium of 6.6, sodium 131, BUN 56, creatinine of 3.  White count 12.7, hemoglobin 8.3.  UA grossly unremarkable.  Chest x-ray with no  active  cardiopulmonary disease.  Patient was given Kayexalate, calcium gluconate for T wave abnormalities and albuterol nebs before [facility] was called for admission and further management  **Patient still not feeling very well. Wanting to leave to go pay her rent. No specific complaints but states that her facility thought she was "acting weird."  Assessment & Plan:   Active Problems:   SLE (systemic lupus erythematosus related syndrome) (HCC)   Anemia   CKD (chronic kidney disease), stage III (HCC)   DVT (deep venous thrombosis) (HCC)   Essential hypertension   Hyperkalemia   Renal failure (ARF), acute on chronic (HCC)   C. difficile colitis  AKI on CKD stage III, suspect prerenal etiology. -Likely related to emesis/loss of volume.   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   -Baseline creatinine 1.6, elevated to 3 here.   -No new medications that could explain -C/w IVF hydration with NS at a rate of 100 mL/hr -Repeat CMP in a.m., check urine lytes -Strict I's and O's -Avoid nephrotoxins -Low threshold to consult nephrology if creatinine continues to worsen especially in light of history of lupus nephritis and possibility of progression of her CKD but will hold off for now given slight improvement in Cr -BUN/Cr went from 56/3.00 -> 47/2.37  Hyperkalemia -In setting of worsening AKI.   -Patient's concerning medications that could contribute.   -EKG showed some acute abnormalities with T waves, no overt peaking, status post Kayexalate Calcium gluconate in the ED as well as albuterol.  -UA shows proteinuria limit dysmorphic cells or other concerning findings -Repeat K+ was 5.1 last night and this AM was 5.2 -Repeat Kayexalate dose and repeat CMP in AM  Elevated Alkaline Phosphatase, isolated.  -Otherwise normal bilirun, slightly elevated ALT.  -Do not suspect biliary involvement -Continue t omonitor on repeat CMP in AM  C. difficile colitis -Denies any abdominal pain or  worsening diarrhea -Continue oral vancomycin 125 makes 4 times daily (previous regimen) started 11/15 -Continue to Monitor output -C/w Enteric precautions  Previously persistent bacteremia and fungemia. -Prolonged hospitalization from 5/6-6/26 with discharge to LTAC.   -Very complicated course as mentioned above in history.   -Patient recently completed course of daptomycin and ampicillin for persistent bacteremia as well as IV diflucan for fungemia.  -TEE on 10/29 neg for endocarditis.   -At her LTAC prior to admission there was some concern for sepsis again however patient has remained afebrile here and hemodynamically stable with no lactic acidosis and no localizing signs or symptoms of infection. -Continue to Monitor blood cultures and showed NGTD <24 hours -Low threshold to start antibiotics if becomes febrile  Lupus, stable.   -Per outside documentation plan was to restart Myfortic once all blood cultures remain negative.   -Will not start very given actively ruling out infection during observation. -Continue Home prednisone  Hypertension, slightly elevated -Resume home meds with Diltiazem 90 mg po q6h and with Metoprolol 50 mg po q12h -Continue to Monitor BP per Protocol   Normocytic chronic anemia, likely anemia of chronic disease related to CKD/lupus.   -Hgb/Hct slightly dropped and went from 8.3/25.4 -> 7.5/23.3 -C/w Home Ferrous Sulfate 325 mg po BID -Continue to Monitor for S/Sx of Bleeding -Repeat CBC in AM   History of antiphospholipid syndrome.   -Previous history of left upper extremity DVT.  No longer on warfarin given significant spontaneous pelvic hematoma requiring IR embolization on last hospitalization 04/2018  -Continue to Closely monitor  -SCDs given catastrophic hemorrhaging on prior anticoagulation  Hyperlipidemia, stable -Continue home Atorvastatin 10 mg po Daily   Depression, stable -Continue home duloxetine and Amitriptyline 25 mg po  qHS  GERD, stable -Continue home Famotidine 20 mg po BID  Tobacco Abuse -Smoking Cessation Counseling given -C/w Nicotine Patch 14 mg TD   Hypermagnesemia -In setting of renal failure -Hold home mag oxide pills -Continue monitor and repeat magnesium level in a.m.  Hyperphosphatemia -Patient's phosphorus level was 6.8 -Continue monitor and repeat phosphorus level -Repeat phosphorus level in a.m.  DVT prophylaxis: SCDs Code Status: FULL CODE Family Communication: No family present at bedside Disposition Plan: Remain Inpatient for continued workup and treatment   Consultants:   None   Procedures: None   Antimicrobials: Anti-infectives (From admission, onward)   Start     Dose/Rate Route Frequency Ordered Stop   09/25/18 1000  dapsone tablet 100 mg     100 mg Oral Daily 09/24/18 2107     09/25/18 0000  vancomycin (VANCOCIN) 50 mg/mL oral solution 125 mg     125 mg Oral Every 6 hours 09/24/18 2118     09/24/18 2200  vancomycin (VANCOCIN) 125 MG capsule 125 mg  Status:  Discontinued     125 mg Oral 4 times daily 09/24/18 2108 09/24/18 2117     Subjective: Seen and examined at bedside and was still feeling bad.  Had no specific complaints but is felt malaise and fatigue.  No chest pain, lightheadedness or dizziness.  No other concerns or complaints at this time.  Objective: Vitals:   09/24/18 2213 09/25/18 0620 09/25/18 0724 09/25/18 1330  BP:  (!) 170/107 (!) 155/105 140/85  Pulse: 100 76 86 71  Resp:  19  14  Temp:  98.1 F (36.7 C)  98.6 F (37 C)  TempSrc:    Oral  SpO2:  99%  95%  Weight:      Height:        Intake/Output Summary (Last 24 hours) at 09/25/2018 1911 Last data filed at 09/25/2018 1500 Gross per 24 hour  Intake 1146.36 ml  Output 425 ml  Net 721.36 ml   Filed Weights   09/24/18 1524  Weight: 63.5 kg   Examination: Physical Exam:  Constitutional: WN/WD thin Caucasian female NAD and appears calm but uncomfortable Eyes: Lids and  conjunctivae normal, sclerae anicteric  ENMT: External Ears, Nose appear normal. Grossly normal hearing. Neck: Appears normal, supple, no cervical masses, normal ROM, no appreciable thyromegaly; no JVD Respiratory: Diminished to auscultation bilaterally, no wheezing, rales, rhonchi or crackles. Normal respiratory effort and patient is not tachypenic. No accessory muscle use.  Cardiovascular: RRR, no murmurs / rubs / gallops. S1 and S2 auscultated. No extremity edema.  Abdomen: Soft, mildly tender, non-distended. No masses palpated. No appreciable hepatosplenomegaly. Bowel sounds positive x4.  GU: Deferred. Musculoskeletal: No clubbing / cyanosis of digits/nails.  Skin: No rashes, lesions, ulcers on a limited skin evaluation. No induration; Warm and dry.  Neurologic: CN 2-12 grossly intact with no focal deficits. Romberg sign and cerebellar reflexes not assessed.  Psychiatric: Normal judgment and insight. Alert and oriented x 3. Slightly anxious mood and appropriate affect.   Data Reviewed: I have personally reviewed following labs and imaging studies  CBC: Recent Labs  Lab 09/24/18 1508 09/25/18 1001  WBC 12.7* 7.7  NEUTROABS 8.5* 5.4  HGB 8.3* 7.5*  HCT 25.4* 23.3*  MCV 98.1 97.9  PLT 295 283   Basic Metabolic Panel: Recent Labs  Lab 09/24/18 1508 09/24/18 2251 09/25/18 1001  NA  131*  --  135  K 6.6* 5.1 5.2*  CL 100  --  107  CO2 20*  --  22  GLUCOSE 103*  --  89  BUN 56*  --  47*  CREATININE 3.00*  --  2.37*  CALCIUM 7.8*  --  8.0*  MG  --   --  2.7*  PHOS  --   --  6.8*   GFR: Estimated Creatinine Clearance: 28 mL/min (A) (by C-G formula based on SCr of 2.37 mg/dL (H)). Liver Function Tests: Recent Labs  Lab 09/24/18 1508 09/25/18 1001  AST 37 33  ALT 58* 47*  ALKPHOS 553* 461*  BILITOT 0.6 0.6  PROT 6.7 5.8*  ALBUMIN 3.1* 2.6*   No results for input(s): LIPASE, AMYLASE in the last 168 hours. No results for input(s): AMMONIA in the last 168  hours. Coagulation Profile: Recent Labs  Lab 09/24/18 1644 09/25/18 1001  INR 1.59 1.60   Cardiac Enzymes: No results for input(s): CKTOTAL, CKMB, CKMBINDEX, TROPONINI in the last 168 hours. BNP (last 3 results) No results for input(s): PROBNP in the last 8760 hours. HbA1C: No results for input(s): HGBA1C in the last 72 hours. CBG: Recent Labs  Lab 09/25/18 0722  GLUCAP 84   Lipid Profile: No results for input(s): CHOL, HDL, LDLCALC, TRIG, CHOLHDL, LDLDIRECT in the last 72 hours. Thyroid Function Tests: No results for input(s): TSH, T4TOTAL, FREET4, T3FREE, THYROIDAB in the last 72 hours. Anemia Panel: No results for input(s): VITAMINB12, FOLATE, FERRITIN, TIBC, IRON, RETICCTPCT in the last 72 hours. Sepsis Labs: Recent Labs  Lab 09/24/18 1518 09/24/18 1702  LATICACIDVEN 1.03 0.86    Recent Results (from the past 240 hour(s))  Blood Culture (routine x 2)     Status: None (Preliminary result)   Collection Time: 09/24/18  4:44 PM  Result Value Ref Range Status   Specimen Description   Final    BLOOD RIGHT ARM Performed at [facility], [address] [place], [place] [address]    Special Requests   Final    BOTTLES DRAWN AEROBIC AND ANAEROBIC Blood Culture adequate volume Performed at [facility], [address] [place], [place] [address]    Culture   Final    NO GROWTH < 24 HOURS Performed at [facility], [address] [place], [place] [address]    Report Status PENDING  Incomplete  Blood Culture (routine x 2)     Status: None (Preliminary result)   Collection Time: 09/24/18  6:05 PM  Result Value Ref Range Status   Specimen Description   Final    BLOOD LEFT ARM Performed at [facility], [address], [place], [place] [address]    Special Requests   Final    BOTTLES DRAWN AEROBIC AND ANAEROBIC Blood Culture adequate volume Performed at [facility], [address] [place], [place] [address]    Culture   Final    NO GROWTH < 24 HOURS Performed at [facility], [address] [place], [place] [address]    Report Status PENDING  Incomplete  MRSA PCR Screening     Status: None   Collection Time: 09/25/18  6:33 AM  Result Value Ref Range Status   MRSA by PCR NEGATIVE NEGATIVE Final    Comment:        The GeneXpert MRSA Assay (FDA approved for NASAL specimens only), is one component of a comprehensive MRSA colonization surveillance  program. It is not intended to diagnose MRSA infection nor to guide or monitor treatment for MRSA infections. Performed at [facility], [address] [place], [place] [address]     Radiology Studies: Dg Chest 2 View  Result Date: 09/24/2018 CLINICAL DATA:  Weakness and chest discomfort over the past few days. Dyspnea. EXAM: CHEST - 2 VIEW COMPARISON:  None. FINDINGS: Heart and mediastinal contours are within normal limits. Slight elevation of the right hemidiaphragm. The lungs are clear. No pulmonary edema, effusion or pneumothorax. No acute nor suspicious osseous abnormalities. IMPRESSION: No active cardiopulmonary disease. Electronically Signed   By: Tollie Eth M.D.   On: 09/24/2018 16:11   Scheduled Meds: . amitriptyline  25 mg Oral QHS  . atorvastatin  10 mg Oral q1800  . calcium carbonate  1 tablet Oral Q breakfast  . cholecalciferol  1,000 Units Oral Daily  . dapsone  100 mg Oral Daily  . diltiazem  90 mg Oral Q6H  . DULoxetine  60 mg Oral BID  . famotidine  20 mg Oral BID  . ferrous sulfate  325 mg Oral BID PC  . magnesium oxide  400 mg Oral BID  . metoprolol tartrate  50 mg Oral Q12H  . nicotine  14 mg Transdermal Daily  . predniSONE  5 mg Oral Q breakfast  . senna-docusate  1 tablet Oral Daily  . vancomycin  125 mg Oral Q6H   Continuous Infusions: . sodium chloride 100 mL/hr at 09/25/18 0624    LOS: 0 days    Merlene Laughter, DO [facility] PAGER is on  AMION  If 7PM-7AM, please contact night-coverage [email or web address] Password [email or web address] 09/25/2018, 7:11 PM

## 2018-09-26 ENCOUNTER — Inpatient Hospital Stay (HOSPITAL_COMMUNITY): Payer: Medicare Other

## 2018-09-26 DIAGNOSIS — E875 Hyperkalemia: Secondary | ICD-10-CM | POA: Diagnosis not present

## 2018-09-26 DIAGNOSIS — N183 Chronic kidney disease, stage 3 (moderate): Secondary | ICD-10-CM | POA: Diagnosis not present

## 2018-09-26 DIAGNOSIS — A0472 Enterocolitis due to Clostridium difficile, not specified as recurrent: Secondary | ICD-10-CM | POA: Diagnosis not present

## 2018-09-26 DIAGNOSIS — N179 Acute kidney failure, unspecified: Secondary | ICD-10-CM | POA: Diagnosis not present

## 2018-09-26 DIAGNOSIS — I82629 Acute embolism and thrombosis of deep veins of unspecified upper extremity: Secondary | ICD-10-CM | POA: Diagnosis not present

## 2018-09-26 LAB — URINE CULTURE

## 2018-09-26 LAB — RETICULOCYTES
Immature Retic Fract: 9.1 % (ref 2.3–15.9)
RBC.: 2.57 MIL/uL — AB (ref 3.87–5.11)
RETIC CT PCT: 3.6 % — AB (ref 0.4–3.1)
Retic Count, Absolute: 91.2 10*3/uL (ref 19.0–186.0)

## 2018-09-26 LAB — CBC WITH DIFFERENTIAL/PLATELET
ABS IMMATURE GRANULOCYTES: 0.06 10*3/uL (ref 0.00–0.07)
BASOS ABS: 0.1 10*3/uL (ref 0.0–0.1)
Basophils Relative: 1 %
EOS ABS: 0.9 10*3/uL — AB (ref 0.0–0.5)
Eosinophils Relative: 11 %
HEMATOCRIT: 27.2 % — AB (ref 36.0–46.0)
Hemoglobin: 8.5 g/dL — ABNORMAL LOW (ref 12.0–15.0)
IMMATURE GRANULOCYTES: 1 %
Lymphocytes Relative: 14 %
Lymphs Abs: 1.2 10*3/uL (ref 0.7–4.0)
MCH: 31.7 pg (ref 26.0–34.0)
MCHC: 31.3 g/dL (ref 30.0–36.0)
MCV: 101.5 fL — AB (ref 80.0–100.0)
MONOS PCT: 8 %
Monocytes Absolute: 0.6 10*3/uL (ref 0.1–1.0)
NEUTROS ABS: 5.4 10*3/uL (ref 1.7–7.7)
NEUTROS PCT: 65 %
NRBC: 0 % (ref 0.0–0.2)
Platelets: 326 10*3/uL (ref 150–400)
RBC: 2.68 MIL/uL — ABNORMAL LOW (ref 3.87–5.11)
RDW: 14.8 % (ref 11.5–15.5)
WBC: 8.2 10*3/uL (ref 4.0–10.5)

## 2018-09-26 LAB — COMPREHENSIVE METABOLIC PANEL
ALBUMIN: 2.7 g/dL — AB (ref 3.5–5.0)
ALT: 235 U/L — AB (ref 0–44)
AST: 301 U/L — AB (ref 15–41)
Alkaline Phosphatase: 1159 U/L — ABNORMAL HIGH (ref 38–126)
Anion gap: 9 (ref 5–15)
BUN: 33 mg/dL — ABNORMAL HIGH (ref 6–20)
CO2: 19 mmol/L — ABNORMAL LOW (ref 22–32)
Calcium: 8.1 mg/dL — ABNORMAL LOW (ref 8.9–10.3)
Chloride: 107 mmol/L (ref 98–111)
Creatinine, Ser: 1.81 mg/dL — ABNORMAL HIGH (ref 0.44–1.00)
GFR calc Af Amer: 39 mL/min — ABNORMAL LOW (ref >60)
GFR calc non Af Amer: 34 mL/min — ABNORMAL LOW (ref >60)
Glucose, Bld: 100 mg/dL — ABNORMAL HIGH (ref 70–99)
Potassium: 5.2 mmol/L — ABNORMAL HIGH (ref 3.5–5.1)
Sodium: 135 mmol/L (ref 135–145)
TOTAL PROTEIN: 6 g/dL — AB (ref 6.5–8.1)
Total Bilirubin: 0.8 mg/dL (ref 0.3–1.2)

## 2018-09-26 LAB — IRON AND TIBC
Iron: 64 ug/dL (ref 28–170)
Saturation Ratios: 35 % — ABNORMAL HIGH (ref 10.4–31.8)
TIBC: 181 ug/dL — ABNORMAL LOW (ref 250–450)
UIBC: 117 ug/dL

## 2018-09-26 LAB — MAGNESIUM: Magnesium: 2.5 mg/dL — ABNORMAL HIGH (ref 1.7–2.4)

## 2018-09-26 LAB — PHOSPHORUS: Phosphorus: 4.3 mg/dL (ref 2.5–4.6)

## 2018-09-26 LAB — FERRITIN: Ferritin: >7500 ng/mL — ABNORMAL HIGH (ref 11–307)

## 2018-09-26 LAB — GLUCOSE, CAPILLARY: GLUCOSE-CAPILLARY: 87 mg/dL (ref 70–99)

## 2018-09-26 LAB — HIV ANTIBODY (ROUTINE TESTING W REFLEX): HIV Screen 4th Generation wRfx: NONREACTIVE

## 2018-09-26 LAB — FOLATE: Folate: 9.6 ng/mL (ref >5.9)

## 2018-09-26 LAB — VITAMIN B12: VITAMIN B 12: 769 pg/mL (ref 180–914)

## 2018-09-26 MED ORDER — SODIUM POLYSTYRENE SULFONATE 15 GM/60ML PO SUSP
15.0000 g | Freq: Once | ORAL | Status: AC
  Administered 2018-09-26: 15 g via ORAL
  Filled 2018-09-26 (×2): qty 60

## 2018-09-26 NOTE — Progress Notes (Signed)
PROGRESS NOTE    Kim Carpenter  UKG:[id number] DOB: [birth year] DOA: 09/24/2018 PCP: Benito Mccreedy, MD   Brief Narrative:  HPI per Dr. Oretha Milch on 09/24/18 HPI: Kim Carpenter is a 42 y.o. 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  History obtained from patient and documentation from [place].  Patient reports this morning not feeling like her usual self before having an episode of worsening nausea followed by large volume nonbloody nonbilious emesis shortly after lunch.  She denied any abdominal pain, fevers, chills, cough, shortness of breath, chest pain at that time.  Per documentation at [facility] there was some concern that patient may have been taking nonprescribed oxycodone in her room.  Remain documentation reports transfer because patient was not "acting like herself" with concern for sepsis given "elevated CO2".  Patient has a very complicated history she has been at [facility] since discharge from [facility].  She had a prolonged hospital stay with course complicated by SLE flare and worsening kidney function with need for intermittent dialysis and requirement of prednisone and cyclosporine for slowly, concern for nonprescribed narcotic use during hospital stay with multiple episodes of behavior, acute hypoxic respiratory failure with ICU  stay requiring intubation, persistent bacteremia (pseudomonal bacteremia and H CAP with MSSA-discharge at that time of meropenem and dapsone) prompting removal of PermCath after intermittent HD/CRRT, left upper extremity DVT requiring Coumadin, discontinuation of warfarin due to spontaneous large pelvic hematoma extending to rectal sheath requiring IR embolization on 6/13.  She was discharged to [facility] on 6/26 for further ventilator management and aggressive therapies  LTAC Course: Continue ventilatory support with failed extubations several times before undergoing tracheostomy, but eventually was able to be weaned from it and decannulated. She did get PEG tube for nutritional support.  At some point had bowel surgery bowel perforation in the setting of peritonitis. ID helped with several antibiotic regimens.  She underwent R thoracentesis for symptomat R pleural effusion. She also had EGD which showed mild gastritis.  She had recurrent bacteremia with VRE. TTE and TEE were negative for endocarditis. ID saw her on 11/6 and recommended 14 total days of IV antibiotics from the day of 1st negative blood cultures which were daptomycin and ampicillin. This has been completed per documentation provided by LTAC summary. She was also treated for fungemia with IV diflucan.  Her only active antibiotic is oral vancomycin for c diff colitis which the plan was to continue for at least 1 week after discontinuation of previously mentioned antibiotics.  Ultimately for her Lupus the plan was to restart her myfortic once her final blood cultures had resulted  ED Course: She was afebrile with T-max 98.4, normal respiratory rate, normal oxygen saturation and blood pressure peak of 160/104Blood cultures were obtained, lactic acid was within normal limits, on CMP was found to have potassium of 6.6, sodium 131, BUN 56, creatinine of 3.  White count 12.7, hemoglobin 8.3.  UA grossly unremarkable.  Chest x-ray with no active  cardiopulmonary disease.  Patient was given Kayexalate, calcium gluconate for T wave abnormalities and albuterol nebs before [facility] was called for admission and further management  **Patient feeling somewhat better today and had a formed BM this AM. Liver Fxn elevated so discussed about working that up and patient states shes never had liver dysfunction before and it is always her kidneys. Renal Fxn improving and patient happy about that   Assessment & Plan:   Active Problems:   SLE (systemic lupus erythematosus related syndrome) (HCC)   Anemia   CKD (chronic kidney disease), stage III (HCC)   DVT (deep venous thrombosis) (HCC)   Essential hypertension   Hyperkalemia   Renal failure (ARF), acute on chronic (HCC)   C. difficile colitis  AKI on CKD stage III, suspect prerenal etiology, improving  -Likely related to emesis/loss of volume.   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   -Baseline creatinine 1.6, elevated to 3 here.   -No new medications that could explain -C/w IVF hydration with NS at a rate of 100 mL/hr -Strict I's and O's -Avoid nephrotoxins -Low threshold to consult nephrology if creatinine continues to worsen especially in light of history of lupus nephritis and possibility of progression of her CKD but Carpenter hold off for now given slight improvement in Cr -BUN/Cr went from 56/3.00 -> 47/2.37 -> 33/1.81 -Continue to Monitor and repeat CMP in AM   Hyperkalemia -In setting of worsening AKI.   -Patient's concerning medications that could contribute.   -EKG showed some acute abnormalities with T waves, no overt peaking, status post Kayexalate Calcium gluconate in the ED as well as albuterol.  -UA shows proteinuria limit dysmorphic cells or other concerning findings -Repeat K+ this AM was again 5.2 -Repeat Kayexalate dose and repeat CMP in AM  Abnormal LFT's and Elevated Alkaline Phosphatase -LFT's jumped from yesterday and AST went from 33 -> 301 -ALT went from 47  -> 235 -Hold Statin currently given acute Elevation in LFTs -Alk Phos went from 461 -> 1,159 -Unclear Etiology  -Check RUQ U/S and Acute Hepatitis Panel -RUQ U/S showed bile duct slightly prominent 8.8 mm and this could be from prior cholecystectomy but given her elevated LFTs we Carpenter obtain MRCP for further evaluation as there is no focal hepatic abnormality noted. -Anemia panel showed iron level 64, U IBC of 117, TIBC of 181, saturation ratio 35%, ferritin level greater than 7500, folate level of 9.6, and vitamin B12 level of 769 -Acute hepatitis panel still pending -We Carpenter obtain MRCP of the abdomen to further evaluate -Repeat CMP in AM   C. difficile colitis -Denies any abdominal pain or worsening diarrhea -Continue oral vancomycin 125 makes 4 times daily (previous regimen) started 11/15 -Continue to Monitor output -C/w Enteric precautions for now   Previously persistent bacteremia and fungemia. -Prolonged hospitalization from 5/6-6/26 with discharge to [place].   -Very complicated course as mentioned above in history.   -Patient recently completed course of daptomycin and ampicillin for persistent bacteremia as well as IV diflucan for fungemia.  -TEE on 10/29 neg for endocarditis.   -At her LTAC prior to admission there was some concern for sepsis again however patient has remained afebrile here and hemodynamically stable with no lactic acidosis and no localizing signs or symptoms of infection. -Continue to Monitor blood cultures and showed NGTD at 2 days -Low threshold to start antibiotics if becomes febrile  Lupus, stable.   -Per outside documentation plan was to restart Myfortic once all blood cultures remain  negative.   -Carpenter not start Myfortic currently given actively ruling out infection during observation. -Continue Home prednisone for now   Hypertension, slightly elevated -Resume home meds with Diltiazem 90 mg po q6h and with Metoprolol 50 mg po q12h -Continue to  Monitor BP per Protocol   Normocytic chronic anemia, likely anemia of chronic disease related to CKD/lupus.   -Hgb/Hct slightly dropped and went from 8.3/25.4 -> 7.5/23.3 -> 8.5/27.2 -C/w Home Ferrous Sulfate 325 mg po BID for now but may need to hold  -Anemia Panel done and showed iron level 64, U IBC of 117, TIBC 118, saturation ratio 35%, ferritin level greater than 7500, folate level 9.6, and vitamin B12 level 769 -Continue to Monitor for S/Sx of Bleeding -Repeat CBC in AM   History of antiphospholipid syndrome.   -Previous history of left upper extremity DVT.  No longer on warfarin given significant spontaneous pelvic hematoma requiring IR embolization on last hospitalization 04/2018  -Continue to Closely monitor  -SCDs given catastrophic hemorrhaging on prior anticoagulation  Hyperlipidemia, stable -Carpenter stop home Atorvastatin 10 mg po Daily   Depression, stable -Continue home Duloxetine and Amitriptyline 25 mg po qHS  GERD, stable -Continue home Famotidine 20 mg po BID  Tobacco Abuse -Smoking Cessation Counseling given -C/w Nicotine Patch 14 mg TD   Hypermagnesemia -In setting of renal failure -Hold home mag oxide pills -Mag level went from 2.7 -> 2.5 -Continue monitor and repeat magnesium level in a.m.  Hyperphosphatemia -Patient's phosphorus level was 6.8 and is now 4.3 -Continue monitor and repeat phosphorus level -Repeat phosphorus level in a.m.  Non-Gap Metabolic Acidosis -Mild -CO2 went from 22 -> 19 -Continue to Monitor and Repeat CMP in AM  DVT prophylaxis: SCDs Code Status: FULL CODE Family Communication: No family present at bedside Disposition Plan: Remain Inpatient for continued workup and treatment   Consultants:   None   Procedures: None   Antimicrobials: Anti-infectives (From admission, onward)   Start     Dose/Rate Route Frequency Ordered Stop   09/25/18 1000  dapsone tablet 100 mg     100 mg Oral Daily 09/24/18 2107      09/25/18 0000  vancomycin (VANCOCIN) 50 mg/mL oral solution 125 mg     125 mg Oral Every 6 hours 09/24/18 2118     09/24/18 2200  vancomycin (VANCOCIN) 125 MG capsule 125 mg  Status:  Discontinued     125 mg Oral 4 times daily 09/24/18 2108 09/24/18 2117     Subjective: Seen and examined states that she is feeling somewhat better and had a bowel movement this morning.  No chest pain, lightheadedness or dizziness.  Says nausea and vomiting is improved.  No other concerns or complaints at this time.  Objective: Vitals:   09/25/18 2147 09/26/18 0456 09/26/18 0500 09/26/18 1419  BP: (!) 150/88 (!) 157/95  115/74  Pulse: 72 70  61  Resp:  16  17  Temp: 98.3 F (36.8 C) 98.7 F (37.1 C)  98.7 F (37.1 C)  TempSrc: Oral Oral  Oral  SpO2: 98% 98%  94%  Weight:   59.1 kg   Height:        Intake/Output Summary (Last 24 hours) at 09/26/2018 1616 Last data filed at 09/25/2018 1900 Gross per 24 hour  Intake -  Output 950 ml  Net -950 ml   Filed Weights   09/24/18 1524 09/26/18 0500  Weight: 63.5 kg 59.1 kg   Examination: Physical Exam:  Constitutional: Well-nourished, well-developed thin  Caucasian female currently no acute distress but appears more calm and more comfortable today Eyes: Lids and conjunctive are normal.  Sclera anicteric ENMT: External ears and nose appear normal.  Grossly normal hearing Neck: Appears supple with no JVD Respiratory: Diminished to auscultation bilaterally with no appreciable wheezing, rales, rhonchi.  Patient not tachypneic using any accessory muscles to breathe Cardiovascular: Regular rate and rhythm.  No appreciable murmurs, rubs, gallops.  No lower extremity edema noted Abdomen: Soft, mildly tender, nondistended.  Has a PEG tube in place.  Bowel sounds present in 4 quadrants.  Has a midline abdominal incision from prior surgeries GU: Deferred Musculoskeletal: No contractures or cyanosis. Skin: No appreciable rashes or lesions on limited skin  evaluation.  Skin is warm and dry Neurologic: Cranial nerves II through XII grossly intact no appreciable focal deficits Psychiatric: Normal judgment and insight.  Patient is alert and oriented x3.  As anxious this morning and an appropriate affect  Data Reviewed: I have personally reviewed following labs and imaging studies  CBC: Recent Labs  Lab 09/24/18 1508 09/25/18 1001 09/26/18 0614  WBC 12.7* 7.7 8.2  NEUTROABS 8.5* 5.4 5.4  HGB 8.3* 7.5* 8.5*  HCT 25.4* 23.3* 27.2*  MCV 98.1 97.9 101.5*  PLT 295 283 892   Basic Metabolic Panel: Recent Labs  Lab 09/24/18 1508 09/24/18 2251 09/25/18 1001 09/26/18 0614  NA 131*  --  135 135  K 6.6* 5.1 5.2* 5.2*  CL 100  --  107 107  CO2 20*  --  22 19*  GLUCOSE 103*  --  89 100*  BUN 56*  --  47* 33*  CREATININE 3.00*  --  2.37* 1.81*  CALCIUM 7.8*  --  8.0* 8.1*  MG  --   --  2.7* 2.5*  PHOS  --   --  6.8* 4.3   GFR: Estimated Creatinine Clearance: 33.8 mL/min (A) (by C-G formula based on SCr of 1.81 mg/dL (H)). Liver Function Tests: Recent Labs  Lab 09/24/18 1508 09/25/18 1001 09/26/18 0614  AST 37 33 301*  ALT 58* 47* 235*  ALKPHOS 553* 461* 1,159*  BILITOT 0.6 0.6 0.8  PROT 6.7 5.8* 6.0*  ALBUMIN 3.1* 2.6* 2.7*   No results for input(s): LIPASE, AMYLASE in the last 168 hours. No results for input(s): AMMONIA in the last 168 hours. Coagulation Profile: Recent Labs  Lab 09/24/18 1644 09/25/18 1001  INR 1.59 1.60   Cardiac Enzymes: No results for input(s): CKTOTAL, CKMB, CKMBINDEX, TROPONINI in the last 168 hours. BNP (last 3 results) No results for input(s): PROBNP in the last 8760 hours. HbA1C: No results for input(s): HGBA1C in the last 72 hours. CBG: Recent Labs  Lab 09/25/18 0722 09/26/18 0756  GLUCAP 84 87   Lipid Profile: No results for input(s): CHOL, HDL, LDLCALC, TRIG, CHOLHDL, LDLDIRECT in the last 72 hours. Thyroid Function Tests: No results for input(s): TSH, T4TOTAL, FREET4, T3FREE,  THYROIDAB in the last 72 hours. Anemia Panel: Recent Labs    09/26/18 0614 09/26/18 1057  VITAMINB12  --  769  FOLATE  --  9.6  FERRITIN  --  >7,500*  TIBC  --  181*  IRON  --  64  RETICCTPCT 3.6*  --    Sepsis Labs: Recent Labs  Lab 09/24/18 1518 09/24/18 1702  LATICACIDVEN 1.03 0.86    Recent Results (from the past 240 hour(s))  Urine culture     Status: Abnormal   Collection Time: 09/24/18  2:50 PM  Result Value  Ref Range Status   Specimen Description   Final    URINE, RANDOM Performed at [facility] [address] [place] [address] [place], [place] [address]    Special Requests   Final    NONE Performed at [facility], [place] [address], [place], [place] [address]    Culture (A)  Final    <10,000 COLONIES/mL INSIGNIFICANT GROWTH Performed at [facility] [address], [place], [place] [address]    Report Status 09/26/2018 FINAL  Final  Blood Culture (routine x 2)     Status: None (Preliminary result)   Collection Time: 09/24/18  4:44 PM  Result Value Ref Range Status   Specimen Description   Final    BLOOD RIGHT ARM Performed at [facility] [address], [place], [place] [address]    Special Requests   Final    BOTTLES DRAWN AEROBIC AND ANAEROBIC Blood Culture adequate volume Performed at [facility] [address], [place], [place] [address]    Culture   Final    NO GROWTH 2 DAYS Performed at [facility] [address], [place], [place] [address]    Report Status PENDING  Incomplete  Blood Culture (routine x 2)     Status: None (Preliminary result)   Collection Time: 09/24/18  6:05 PM  Result Value Ref Range Status   Specimen Description   Final    BLOOD LEFT ARM Performed at [facility] [address] [place], [place] [address]    Special Requests   Final    BOTTLES DRAWN AEROBIC AND ANAEROBIC Blood Culture adequate volume Performed at [facility] [address] [place], [place] [address]    Culture   Final    NO GROWTH 2 DAYS Performed at [facility] [address] [place], [place] [address]    Report Status PENDING  Incomplete  MRSA PCR Screening     Status: None   Collection Time: 09/25/18  6:33 AM  Result Value Ref Range Status   MRSA by PCR NEGATIVE NEGATIVE Final    Comment:        The GeneXpert MRSA Assay (FDA approved for NASAL specimens only), is one component of a comprehensive MRSA colonization surveillance program. It is not intended to diagnose MRSA infection nor to guide or monitor treatment for MRSA infections. Performed at [facility], [place] [address] [place],  [address]     Radiology Studies: US Abdomen Limited Ruq  Result Date: 09/26/2018 CLINICAL DATA:  Abnormal LFTs. EXAM: ULTRASOUND ABDOMEN LIMITED RIGHT UPPER QUADRANT COMPARISON:  No prior. FINDINGS: Gallbladder: Cholecystectomy. Common bile duct: Diameter: 8.8 mm Liver: No focal hepatic abnormality identified. Increased echogenicity of the kidney suggesting chronic medical renal disease. Portal vein is patent on color Doppler imaging with normal direction of blood flow towards the liver. IMPRESSION: 1. Cholecystectomy. Common bile duct is slightly prominent at 8.8 mm, this could be from prior cholecystectomy. Given elevated LFTs MRCP should be considered for further evaluation. 2.  No focal hepatic abnormality. 3. Increased echogenicity of the right kidney suggesting chronic medical renal disease. Electronically Signed   By: Marcello Moores  Register   On: 09/26/2018 12:07   Scheduled Meds: . amitriptyline  25 mg Oral QHS  . calcium carbonate  1 tablet Oral Q breakfast  . cholecalciferol  1,000 Units Oral Daily  . dapsone  100 mg Oral Daily  . diltiazem  90  mg Oral Q6H  . DULoxetine  60 mg Oral BID  . famotidine  20 mg Oral BID  . ferrous sulfate  325 mg Oral BID PC  . metoprolol tartrate  50 mg  Oral Q12H  . nicotine  14 mg Transdermal Daily  . predniSONE  5 mg Oral Q breakfast  . senna-docusate  1 tablet Oral Daily  . vancomycin  125 mg Oral Q6H   Continuous Infusions: . sodium chloride 75 mL/hr at 09/26/18 1222    LOS: 1 day    Kerney Elbe, DO [facility] PAGER is on AMION  If 7PM-7AM, please contact night-coverage [email or web address] Password [email or web address] 09/26/2018, 4:16 PM

## 2018-09-26 NOTE — Progress Notes (Signed)
Initial Nutrition Assessment  INTERVENTION:   Provide Magic cup TID with meals, each supplement provides 290 kcal and 9 grams of protein (if orders from kitchen).  NUTRITION DIAGNOSIS:   Inadequate oral intake related to poor appetite as evidenced by per patient/family report.  GOAL:   Patient will meet greater than or equal to 90% of their needs  MONITOR:   PO intake, Supplement acceptance, Labs, Weight trends, I & O's  REASON FOR ASSESSMENT:   Malnutrition Screening Tool    ASSESSMENT:   42 y.o. 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   Patient reports not eating anything today. States she doesn't really have an appetite today. RD offered to order a meal for her and she declined. Per RN, pt ordered food from [employer] last night and suspects that pt doesn't like the hospital food. Had some food stored in her cabinet in her room.  Patient not very talkative as she just woke up from a nap. Did not provide any further nutrition history.  Per chart review, pt has a PEG-J tube but does not use it currently for nutrition.   Per weight history in chart.   Medications: OS-CAL tablet daily, Vitamin D tablet daily, Ferrous sulfate BID Labs reviewed:  Elevated K, Mg Phos WNL GFR: 34   NUTRITION - FOCUSED PHYSICAL EXAM:    Most Recent Value   Orbital Region  No depletion  Upper Arm Region  Mild depletion  Thoracic and Lumbar Region  Unable to assess  Buccal Region  Mild depletion  Temple Region  Mild depletion  Clavicle Bone Region  Mild depletion  Clavicle and Acromion Bone Region  Mild depletion  Scapular Bone Region  Unable to assess  Dorsal Hand  No depletion  Patellar Region  Unable to assess  Anterior Thigh Region  Unable to assess  Posterior Calf Region  Unable to assess  Edema (RD Assessment)  None       Diet Order:   Diet Order            Diet regular Room service appropriate? Yes; Fluid consistency: Thin  Diet effective now              EDUCATION NEEDS:   No education needs have been identified at this time  Skin:  Skin Assessment: Reviewed RN Assessment  Last BM:  11/20  Height:   Ht Readings from Last 1 Encounters:  09/24/18 5\' 3"  (1.6 m)    Weight:   Wt Readings from Last 1 Encounters:  09/26/18 59.1 kg    Ideal Body Weight:  52.3 kg  BMI:  Body mass index is 23.06 kg/m.  Estimated Nutritional Needs:   Kcal:  1400-1600  Protein:  70-80g  Fluid:  1.6L/day   Tilda FrancoLindsey Vitaly Wanat, MS, RD, LDN [facility] Clinical Dietitian Pager: [phone] After Hours Pager: [phone]

## 2018-09-27 ENCOUNTER — Inpatient Hospital Stay (HOSPITAL_COMMUNITY): Payer: Medicare Other

## 2018-09-27 DIAGNOSIS — I82629 Acute embolism and thrombosis of deep veins of unspecified upper extremity: Secondary | ICD-10-CM | POA: Diagnosis not present

## 2018-09-27 DIAGNOSIS — R4189 Other symptoms and signs involving cognitive functions and awareness: Secondary | ICD-10-CM

## 2018-09-27 DIAGNOSIS — E875 Hyperkalemia: Secondary | ICD-10-CM | POA: Diagnosis not present

## 2018-09-27 DIAGNOSIS — N183 Chronic kidney disease, stage 3 (moderate): Secondary | ICD-10-CM | POA: Diagnosis not present

## 2018-09-27 DIAGNOSIS — A0472 Enterocolitis due to Clostridium difficile, not specified as recurrent: Secondary | ICD-10-CM | POA: Diagnosis not present

## 2018-09-27 DIAGNOSIS — N179 Acute kidney failure, unspecified: Secondary | ICD-10-CM | POA: Diagnosis not present

## 2018-09-27 LAB — CBC WITH DIFFERENTIAL/PLATELET
ABS IMMATURE GRANULOCYTES: 0.03 10*3/uL (ref 0.00–0.07)
BASOS PCT: 1 %
Basophils Absolute: 0.1 10*3/uL (ref 0.0–0.1)
Eosinophils Absolute: 0.9 10*3/uL — ABNORMAL HIGH (ref 0.0–0.5)
Eosinophils Relative: 13 %
HCT: 24.7 % — ABNORMAL LOW (ref 36.0–46.0)
Hemoglobin: 7.8 g/dL — ABNORMAL LOW (ref 12.0–15.0)
Immature Granulocytes: 1 %
Lymphocytes Relative: 19 %
Lymphs Abs: 1.2 10*3/uL (ref 0.7–4.0)
MCH: 31.2 pg (ref 26.0–34.0)
MCHC: 31.6 g/dL (ref 30.0–36.0)
MCV: 98.8 fL (ref 80.0–100.0)
MONO ABS: 0.6 10*3/uL (ref 0.1–1.0)
MONOS PCT: 9 %
NEUTROS ABS: 3.8 10*3/uL (ref 1.7–7.7)
Neutrophils Relative %: 57 %
Platelets: 277 10*3/uL (ref 150–400)
RBC: 2.5 MIL/uL — ABNORMAL LOW (ref 3.87–5.11)
RDW: 14.4 % (ref 11.5–15.5)
WBC: 6.5 10*3/uL (ref 4.0–10.5)
nRBC: 0 % (ref 0.0–0.2)

## 2018-09-27 LAB — HEPATITIS PANEL, ACUTE
HEP A IGM: NEGATIVE
HEP B S AG: NEGATIVE
Hep B C IgM: NEGATIVE

## 2018-09-27 LAB — COMPREHENSIVE METABOLIC PANEL
ALT: 179 U/L — ABNORMAL HIGH (ref 0–44)
ANION GAP: 6 (ref 5–15)
AST: 124 U/L — ABNORMAL HIGH (ref 15–41)
Albumin: 2.5 g/dL — ABNORMAL LOW (ref 3.5–5.0)
Alkaline Phosphatase: 1093 U/L — ABNORMAL HIGH (ref 38–126)
BILIRUBIN TOTAL: 0.4 mg/dL (ref 0.3–1.2)
BUN: 30 mg/dL — AB (ref 6–20)
CHLORIDE: 109 mmol/L (ref 98–111)
CO2: 20 mmol/L — ABNORMAL LOW (ref 22–32)
Calcium: 8.1 mg/dL — ABNORMAL LOW (ref 8.9–10.3)
Creatinine, Ser: 1.83 mg/dL — ABNORMAL HIGH (ref 0.44–1.00)
GFR, EST AFRICAN AMERICAN: 39 mL/min — AB (ref >60)
GFR, EST NON AFRICAN AMERICAN: 33 mL/min — AB (ref >60)
Glucose, Bld: 99 mg/dL (ref 70–99)
POTASSIUM: 4.6 mmol/L (ref 3.5–5.1)
Sodium: 135 mmol/L (ref 135–145)
Total Protein: 5.7 g/dL — ABNORMAL LOW (ref 6.5–8.1)

## 2018-09-27 LAB — MAGNESIUM: MAGNESIUM: 2.1 mg/dL (ref 1.7–2.4)

## 2018-09-27 LAB — GLUCOSE, CAPILLARY
Glucose-Capillary: 95 mg/dL (ref 70–99)
Glucose-Capillary: 110 mg/dL — ABNORMAL HIGH (ref 70–99)

## 2018-09-27 LAB — PHOSPHORUS: PHOSPHORUS: 3.8 mg/dL (ref 2.5–4.6)

## 2018-09-27 MED ORDER — NALOXONE HCL 0.4 MG/ML IJ SOLN
0.4000 mg | INTRAMUSCULAR | Status: DC | PRN
  Administered 2018-09-27: 0.4 mg via INTRAVENOUS

## 2018-09-27 MED ORDER — NALOXONE HCL 0.4 MG/ML IJ SOLN
INTRAMUSCULAR | Status: AC
  Filled 2018-09-27: qty 1

## 2018-09-27 MED ORDER — GADOBUTROL 1 MMOL/ML IV SOLN: 6.0000 mL | Freq: Once | INTRAVENOUS | Status: DC | PRN

## 2018-09-27 NOTE — Progress Notes (Signed)
Patient elected to leave against medical advice, when tele-monitor and room check completed.  Patient was alert and oriented and fully competent to make that decision for herself.

## 2018-09-27 NOTE — Significant Event (Signed)
Rapid Response Event Note  Overview:  Event Type: Neurologic, Cardiac  Initial Focused Assessment:  Patient had been identified as bradycardic by centralized telemetry and RN notified. Upon assessment by RN patient unresponsive but breathing with a pulse. Patient given Narcan per MD and patient had immediate positive response. Narcan administered prior to RRT arrival. On my assessment patient alert with stable vital signs.  Pupils dialated and very slow to react to light. Patient oriented X 4 and was asked if she had taken any illicit substances while her friend was visiting as episode corresponded with the timing of visits, patient denied and threatened to leave AMA after asking if she would be okay with a room search. Patient denied drug use and denied search.  MD ordered head CT, UDS, and Narcan. Patient agitated at mention of substance abuse, but agreed not to leave AMA.  RRT available as needed.     Event Summary: Name of Physician Notified: Sheikh at 1240    at    Outcome: Stayed in room and stabalized  Event End Time: 1300  Kim Carpenter

## 2018-09-27 NOTE — Discharge Summary (Signed)
Physician Discharge Summary  UVA RUNKEL JME:[id number] DOB: Feb 03, 1976 DOA: 09/24/2018  PCP: Benito Mccreedy, MD  Admit date: 09/24/2018 Discharge date: 09/27/2018  Admitted From: LTAC [facility] Disposition: Signed out Against Medical Advice  Recommendations for Outpatient Follow-up:  1. Follow up with PCP in 1-2 weeks 2. Follow up with Nephrology in the outpatient setting  3. Please obtain CBC, CMP, Mag, Phos within week  Home Health: No Equipment/Devices: None  Discharge Condition: Guarded   CODE STATUS: FULL CODE Diet recommendation: Regular Diet  Brief/Interim Summary: HPI per Dr. 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 History obtained from patient and documentation from [place]. Patient reports this morning not feeling like her usual self before having an episode of worsening nausea followed by large volume nonbloody nonbilious emesis shortly after lunch. She denied any abdominal pain, fevers, chills, cough, shortness of breath, chest pain at that time. Per documentation at [facility] there was some concern that patient may have been taking nonprescribed oxycodone in her room. Remain documentation reports transfer because patient was not "acting like herself" with concern for  sepsis given "elevated CO2".  Patient has a very complicated history she has been at [facility] since discharge from [facility] 05/01/2018. She had a prolonged hospital stay with course complicated by SLE flare and worsening kidney function with need for intermittent dialysis and requirement of prednisone and cyclosporine for slowly, concern for nonprescribed narcotic use during hospital stay with multiple episodes of behavior, acute hypoxic respiratory failure with ICU stay requiring intubation, persistent bacteremia (pseudomonal bacteremia and H CAP with MSSA-discharge at that time of meropenem and dapsone) prompting removal of PermCath after intermittent HD/CRRT, left upper extremity DVT requiring Coumadin, discontinuation of warfarin due to spontaneous large pelvic hematoma extending to rectal sheath requiring IR embolization on 6/13. She was discharged to [facility] on 6/26 for further ventilator management and aggressive therapies  LTAC Course: Continue ventilatory support with failed extubations several times before undergoing tracheostomy, but eventually was able to be weaned from it and decannulated. She did get PEG tube for nutritional support. At some point had bowel surgery bowel perforation in the setting of peritonitis. ID helped with several antibiotic regimens. She underwent R thoracentesis for symptomat R pleural effusion. She also had EGD which showed mild gastritis. She had recurrent bacteremia with VRE. TTE and TEE were negative for endocarditis. ID saw her on 11/6 and recommended 14 total days of IV antibiotics from the day of 1st negative blood cultures which were daptomycin and ampicillin. This has been completed per documentation provided by LTAC summary. She was also treated for fungemia with IV diflucan. Her only active antibiotic is oral vancomycin for c diff colitis which the plan was to continue for at least 1 week after discontinuation of previously mentioned antibiotics.  Ultimately for her Lupus the plan was to restart her myfortic once her final blood cultures had resulted  ED Course:She was afebrile with T-max 98.4, normal respiratory  rate, normal oxygen saturation and blood pressure peak of 160/104Blood cultures were obtained, lactic acid was within normal limits, on CMP was found to have potassium of 6.6, sodium 131, BUN 56, creatinine of 3. White count 12.7, hemoglobin 8.3. UA grossly unremarkable. Chest x-ray with no active cardiopulmonary disease. Patient was given Kayexalate, calcium gluconate for T wave abnormalities and albuterol nebs before [facility] hospitalist was called for admission and further management  **This afternoon the patient had a rapid response called on her and was noted to be bradycardic and somnolent with eyes rolled back in her head and back arched.  She did not respond to sternal rub but was breathing on pulse.  The nurse paged me and I ordered Narcan and the patient almost immediately came to and was able to carry on conversation.  Prior to this episode she had a visitor and the visitor left the room just prior to this episode occurring.  I also ordered a head CT as well as a UDS a telemetry sitter was ordered in the house supervisor was notified.  Patient refused for her room to be searched, but according to our hospital policy per the house supervisor the room is able to be discharged without the patient's personal belongings searched.  Security was called to do a room search and because of security attempting to do a room search the patient decided to leave [place] and she was coherent enough and was not enough after being given Narcan.  She is advised to follow-up with her primary care physician and understands the risks of leaving [place] including decompensation, worsening condition even death.  Discharge Diagnoses:  Active Problems:   SLE (systemic lupus erythematosus related syndrome) (HCC)   Anemia    CKD (chronic kidney disease), stage III (HCC)   DVT (deep venous thrombosis) (HCC)   Essential hypertension   Hyperkalemia   Renal failure (ARF), acute on chronic (HCC)   C. difficile colitis  AKI on CKD stage III, suspect prerenal etiology, improving  -Likely related to emesis/loss of volume.  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  -Baseline creatinine 1.6, elevated to 3 here.  -No new medications that could explain -C/w IVF hydration with NS at a rate of 100 mL/hr -Strict I's and O's -Avoid nephrotoxins -Low threshold to consult nephrology if creatinine continues to worsen especially in light of history of lupus nephritis and possibility of progression of her CKD but will hold off for now given slight improvement in Cr -BUN/Cr went from 56/3.00 -> 47/2.37 -> 33/1.81 -> 30/1.83 -Continue to Monitor and repeat CMP in AM   Somnolence/Episode of Unresponsiveness -Central Telemetry found patient to brady down into th 30's and nurse was notified -Nurse the patient unresponsive with her head and back arched. -Eyes were rolled back in her head and she did not respond to verbal or physical stimuli -Nurse page me immediately and I told the nurse to give a dose of Narcan and patient immediately came to -Stat head CT was ordered along with a UDS -Prior to this episode patient had a visitor and that was her left the room just prior to the patient becoming unresponsive -Patient awoke and was alert and oriented after the Narcan and pupils were very dilated -Patient refused a room search however due to hospital policy the room can be discharged but not the patient's belongings a security was called -Because of this the patient became very agitated and appeared very fidgety and decided to sign out AGAINST  MEDICAL ADVICE -Patient was of sound mind and was alert and oriented and cognizant of and off to make the decisions and understands the dangers of signing out [place] -I strongly  believe the patient took some illicit substances and then tried to hide them and that is why she refused a room search.  She became responsive almost immediately after Narcan was administered and she does have a history of substance abuse in the past  Hyperkalemia -In setting of worsening AKI.  -Patient's concerning medications that could contribute.  -EKG showed some acute abnormalities with T waves, no overt peaking, status post Kayexalate Calcium gluconate in the ED as well as albuterol.  -UA shows proteinuria limit dysmorphic cells or other concerning findings -Repeat K+ yesterday AM was again 5.2 -Repeat Kayexalate dose and repeat CMP this AM showed 4.6 -Patient signed out AMA  Abnormal LFT's and Elevated Alkaline Phosphatase -LFT's jumped from yesterday and AST went from 33 -> 301 -> 124 -ALT went from 47 -> 235 -> 179 -Hold Statin currently given acute Elevation in LFTs -Alk Phos went from 461 -> 1,159 -> 1,093 -Unclear Etiology but ? If patient was doing illicit substances in the room -Check RUQ U/S and Acute Hepatitis Panel -RUQ U/S showed bile duct slightly prominent 8.8 mm and this could be from prior cholecystectomy but given her elevated LFTs we will obtain MRCP for further evaluation as there is no focal hepatic abnormality noted. -Anemia panel showed iron level 64, UIBC of 117, TIBC of 181, saturation ratio 35%, ferritin level greater than 7500, folate level of 9.6, and vitamin B12 level of 769 -Acute Hepatitis panel still Negative  -We will obtain MRCP of the abdomen to further evaluate -MRCP of Abdomen showed: "Limited motion degraded incomplete MRI/MRCP, see comments. Bile ducts are within normal post cholecystectomy limits. CBD diameter 8 mm. No evidence of choledocholithiasis. 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 difficile colitis -Denies any abdominal pain or worsening diarrhea -Continue oral  vancomycin 125 makes 4 times daily (previous regimen) started 11/15 -Continue to Monitor output -C/w Enteric precautions for now  -Patient Signed out [place]  Previously persistent bacteremia and fungemia. -Prolonged hospitalization from 5/6-6/26 with discharge to [place].  -Very complicated course as mentioned above in history.  -Patient recently completed course of daptomycin and ampicillin for persistent bacteremia as well as IV diflucan for fungemia.  -TEE on 10/29 neg for endocarditis.  -At her LTAC prior to admission there was some concern for sepsis again however patient has remained afebrile here and hemodynamically stable with no lactic acidosis and no localizing signs or symptoms of infection. -Continue to Monitor blood cultures and showed NGTD at 2 days still -Low threshold to start antibiotics if becomes febrile -Patient signed out Against Medical Advice   Lupus, stable. -Per outside documentation plan was to restart Myfortic once all blood cultures remain negative.  -Will not start Myfortic currently given actively ruling out infection during observation. -Continue Home prednisone for now  -Follow up for Myfortic as an outpatient -Patient signed out Against Medical Advice   Hypertension, slightly elevated -Resume home meds with Diltiazem 90 mg po q6h and with Metoprolol 50 mg po q12h -Continue to Monitor BP per Protocol   Normocytic chronic anemia, likely anemia of chronic disease related to CKD/lupus.  -Hgb/Hct slightly dropped and went from 8.3/25.4 -> 7.5/23.3 -> 8.5/27.2 -> 7.8/24.7 -C/w Home Ferrous Sulfate 325 mg po BID for now but  may need to hold  -Anemia Panel done and showed iron level 64, U IBC of 117, TIBC 118, saturation ratio 35%, ferritin level greater than 7500, folate level 9.6, and vitamin B12 level 769 -Continue to Monitor for S/Sx of Bleeding -Repeat CBC in AM   History of Antiphospholipid Syndrome.  -Previous history of left upper extremity  DVT. No longer on warfarin given significant spontaneous pelvic hematoma requiring IR embolization on last hospitalization 04/2018  -Continue to Closely monitor  -SCDs given catastrophic hemorrhaging on prior anticoagulation  Hyperlipidemia, stable -Stopped home Atorvastatin 10 mg po Daily given elevated LFTs  Depression, stable -Continue home Duloxetine and Amitriptyline 25 mg po qHS  GERD, stable -Continue home Famotidine 20 mg po BID  Tobacco Abuse -Smoking Cessation Counseling given -C/w Nicotine Patch 14 mg TD   Hypermagnesemia, improved -In setting of renal failure -Hold home mag oxide pills -Mag level went from 2.7 -> 2.5 -> 2.1 -Continue monitor and repeat magnesium level in a.m.  Hyperphosphatemia -Patient's phosphorus level was 6.8 and is now 3.8 -Continue monitor and repeat phosphorus level -Repeat phosphorus level in a.m but patient left AMA.  Non-Gap Metabolic Acidosis -Mild -CO2 went from 22 -> 19 -> 20 -Continue to Monitor but patient left AMA   Discharge Instructions  Allergies as of 09/27/2018      Reactions   Clindamycin/lincomycin    Doxycycline    Levofloxacin    Minocycline    Morphine And Related    Sulfamethoxazole    Tacrolimus    Tape       Medication List    STOP taking these medications   atorvastatin 10 MG tablet Commonly known as:  LIPITOR     TAKE these medications   amitriptyline 25 MG tablet Commonly known as:  ELAVIL Take 25 mg by mouth at bedtime.   calcium carbonate 1500 (600 Ca) MG Tabs tablet Commonly known as:  OSCAL Take 1,500 mg by mouth daily with breakfast.   cholecalciferol 25 MCG (1000 UT) tablet Commonly known as:  VITAMIN D3 Take 1,000 Units by mouth daily.   dapsone 100 MG tablet Take 100 mg by mouth daily.   diltiazem 90 MG tablet Commonly known as:  CARDIZEM Take 90 mg by mouth 4 (four) times daily.   DULoxetine 60 MG capsule Commonly known as:  CYMBALTA Take 60 mg by mouth 2 (two)  times daily.   famotidine 20 MG tablet Commonly known as:  PEPCID Take 20 mg by mouth 2 (two) times daily.   ferrous sulfate 325 (65 FE) MG tablet Take 325 mg by mouth every 12 (twelve) hours.   magnesium oxide 400 MG tablet Commonly known as:  MAG-OX Take 400 mg by mouth 2 (two) times daily.   metoprolol tartrate 50 MG tablet Commonly known as:  LOPRESSOR Take 50 mg by mouth every 12 (twelve) hours.   nicotine 14 mg/24hr patch Commonly known as:  NICODERM CQ - dosed in mg/24 hours Place 14 mg onto the skin daily.   predniSONE 5 MG tablet Commonly known as:  DELTASONE Take 5 mg by mouth daily with breakfast.   sennosides-docusate sodium 8.6-50 MG tablet Commonly known as:  SENOKOT-S Take 1 tablet by mouth daily.   Vancomycin HCl 50 MG/ML Solr Take 125 mg by mouth every 6 (six) hours. Oral Syringe       Allergies  Allergen Reactions  . Clindamycin/Lincomycin   . Doxycycline   . Levofloxacin   . Minocycline   . Morphine And Related   .  Sulfamethoxazole   . Tacrolimus   . Tape    Consultations:  None  Procedures/Studies: Dg Chest 2 View  Result Date: 09/24/2018 CLINICAL DATA:  Weakness and chest discomfort over the past few days. Dyspnea. EXAM: CHEST - 2 VIEW COMPARISON:  None. FINDINGS: Heart and mediastinal contours are within normal limits. Slight elevation of the right hemidiaphragm. The lungs are clear. No pulmonary edema, effusion or pneumothorax. No acute nor suspicious osseous abnormalities. IMPRESSION: No active cardiopulmonary disease. Electronically Signed   By: Ashley Royalty M.D.   On: 09/24/2018 16:11   Mr Abdomen Mrcp Wo Contrast  Result Date: 09/27/2018 CLINICAL DATA:  Elevated liver function tests. History of cholecystectomy in 2012. Prominent CBD on sonography. EXAM: MRI ABDOMEN WITHOUT CONTRAST  (INCLUDING MRCP) TECHNIQUE: Multiplanar multisequence MR imaging of the abdomen was performed. Heavily T2-weighted images of the biliary and pancreatic  ducts were obtained, and three-dimensional MRCP images were rendered by post processing. COMPARISON:  09/26/2018 right upper quadrant abdominal sonogram. FINDINGS: The MRI/MRCP study is incomplete due to early termination at the patient's request. In particular, no T1 weighted imaging was performed. DWI sequence is incomplete. Scan is limited by motion degradation. Lower chest: Mild scarring versus atelectasis at the dependent lung bases. Hepatobiliary: Normal liver size and configuration. 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 No evidence of a liver mass on these limited sequences. Cholecystectomy. Bile ducts are within normal post cholecystectomy limits. Common bile duct diameter 8 mm, with smooth distal tapering. No filling defects to suggest choledocholithiasis. No biliary strictures. Pancreas: No duct dilation or appreciable mass in the pancreas on limited views. No pancreas divisum. Spleen: Low T2 signal intensity throughout normal size spleen, suggesting hemosiderosis. No splenic mass. Adrenals/Urinary Tract: No discrete adrenal nodules. No hydronephrosis. Normal size kidneys. No renal masses. Stomach/Bowel: Stomach is nondistended. Percutaneous gastrostomy tube is in place in the proximal stomach. Visualized small and large bowel is normal caliber, with no bowel wall thickening. Vascular/Lymphatic: Normal caliber abdominal aorta. No pathologically enlarged lymph nodes in the abdomen. Other: No abdominal ascites or focal fluid collection. Musculoskeletal: No aggressive appearing focal osseous lesions. IMPRESSION: 1. Limited motion degraded incomplete MRI/MRCP, see comments. 2. Bile ducts are within normal post cholecystectomy limits. CBD diameter 8 mm. No evidence of choledocholithiasis. 3. 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 Electronically Signed   By: Ilona Sorrel M.D.   On: 09/27/2018 11:01   Mr 3d Recon At  Scanner  Result Date: 09/27/2018 CLINICAL DATA:  Elevated liver function tests. History of cholecystectomy in 2012. Prominent CBD on sonography. EXAM: MRI ABDOMEN WITHOUT CONTRAST  (INCLUDING MRCP) TECHNIQUE: Multiplanar multisequence MR imaging of the abdomen was performed. Heavily T2-weighted images of the biliary and pancreatic ducts were obtained, and three-dimensional MRCP images were rendered by post processing. COMPARISON:  09/26/2018 right upper quadrant abdominal sonogram. FINDINGS: The MRI/MRCP study is incomplete due to early termination at the patient's request. In particular, no T1 weighted imaging was performed. DWI sequence is incomplete. Scan is limited by motion degradation. Lower chest: Mild scarring versus atelectasis at the dependent lung bases. Hepatobiliary: Normal liver size and configuration. 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 No evidence of a liver mass on these limited sequences. Cholecystectomy. Bile ducts are within normal post cholecystectomy limits. Common bile duct diameter 8 mm, with smooth distal tapering. No filling defects to suggest choledocholithiasis. No biliary strictures. Pancreas: No duct dilation or appreciable mass in the pancreas on limited  views. No pancreas divisum. Spleen: Low T2 signal intensity throughout normal size spleen, suggesting hemosiderosis. No splenic mass. Adrenals/Urinary Tract: No discrete adrenal nodules. No hydronephrosis. Normal size kidneys. No renal masses. Stomach/Bowel: Stomach is nondistended. Percutaneous gastrostomy tube is in place in the proximal stomach. Visualized small and large bowel is normal caliber, with no bowel wall thickening. Vascular/Lymphatic: Normal caliber abdominal aorta. No pathologically enlarged lymph nodes in the abdomen. Other: No abdominal ascites or focal fluid collection. Musculoskeletal: No aggressive appearing focal osseous lesions. IMPRESSION: 1. Limited motion  degraded incomplete MRI/MRCP, see comments. 2. Bile ducts are within normal post cholecystectomy limits. CBD diameter 8 mm. No evidence of choledocholithiasis. 3. 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 Electronically Signed   By: Ilona Sorrel M.D.   On: 09/27/2018 11:01   US Abdomen Limited Ruq  Result Date: 09/26/2018 CLINICAL DATA:  Abnormal LFTs. EXAM: ULTRASOUND ABDOMEN LIMITED RIGHT UPPER QUADRANT COMPARISON:  No prior. FINDINGS: Gallbladder: Cholecystectomy. Common bile duct: Diameter: 8.8 mm Liver: No focal hepatic abnormality identified. Increased echogenicity of the kidney suggesting chronic medical renal disease. Portal vein is patent on color Doppler imaging with normal direction of blood flow towards the liver. IMPRESSION: 1. Cholecystectomy. Common bile duct is slightly prominent at 8.8 mm, this could be from prior cholecystectomy. Given elevated LFTs MRCP should be considered for further evaluation. 2.  No focal hepatic abnormality. 3. Increased echogenicity of the right kidney suggesting chronic medical renal disease. Electronically Signed   By: Marcello Moores  Register   On: 09/26/2018 12:07    Subjective: Seen and examined this morning after her MRCP and she just come back was doing well.  States that she is feeling a little bit better.  LFTs are trending down and we are awaiting the results of her MRCP.  This afternoon rapid response was called on patient as she became unresponsive with her eyes rolling back in her head and she bradycardia down into the 30s.  Upon evaluation by the bedside nurse he did sternal rub and she did not come to.  I was informed about the rapid response and ordered Narcan and this was administered and patient immediately almost became responsive.  Prior to this episode she had a visitor calm and it seems as if she took something illicit.  Patient did not agree to having her room search but per hospital policy the room can be searched  but not the personal belongings and because of this patient once she is awake and alert and oriented decided that she wanted to leave [place].  Dangers of leaving AGAINST MEDICAL ADVICE were discussed and patient still wanted to leave so she signed the [place] form and walked out of the hospital.  Discharge Exam: Vitals:   09/27/18 1227 09/27/18 1230  BP: 133/72 (!) 157/95  Pulse: 83 95  Resp:    Temp:    SpO2: (!) 85% 97%   Vitals:   09/26/18 2022 09/27/18 0453 09/27/18 1227 09/27/18 1230  BP: (!) 152/84 (!) 148/88 133/72 (!) 157/95  Pulse: 72 71 83 95  Resp: 18 19    Temp: 98.1 F (36.7 C) 97.9 F (36.6 C)    TempSrc: Oral Oral    SpO2: 95% 94% (!) 85% 97%  Weight:  62.8 kg    Height:       General: Pt is alert, awake, not in acute distress; Eyes are dilated  Cardiovascular: Slightly on the faster side but RRR, S1/S2 +, no rubs,  no gallops Respiratory: Diminished bilaterally, no wheezing, no rhonchi Abdominal: Soft, NT, ND, bowel sounds + Extremities: no edema, no cyanosis  The results of significant diagnostics from this hospitalization (including imaging, microbiology, ancillary and laboratory) are listed below for reference.    Microbiology: Recent Results (from the past 240 hour(s))  Urine culture     Status: Abnormal   Collection Time: 09/24/18  2:50 PM  Result Value Ref Range Status   Specimen Description   Final    URINE, RANDOM Performed at [facility] [address] [place], [place] [address]    Special Requests   Final    NONE Performed at [facility], [place] [address] [place], [place] [address]    Culture (A)  Final    <10,000 COLONIES/mL INSIGNIFICANT GROWTH Performed at [facility] [address], [place], [place] [address]    Report Status 09/26/2018 FINAL  Final  Blood Culture (routine x 2)     Status: None (Preliminary result)   Collection Time: 09/24/18  4:44 PM  Result Value Ref Range  Status   Specimen Description   Final    BLOOD RIGHT ARM Performed at [facility] [address] [place], [place] [address]    Special Requests   Final    BOTTLES DRAWN AEROBIC AND ANAEROBIC Blood Culture adequate volume Performed at [facility] [address] [place], [place] [address]    Culture   Final    NO GROWTH 2 DAYS Performed at [facility] [address] [place], [place] [address]    Report Status PENDING  Incomplete  Blood Culture (routine x 2)     Status: None (Preliminary result)   Collection Time: 09/24/18  6:05 PM  Result Value Ref Range Status   Specimen Description   Final    BLOOD LEFT ARM Performed at [facility] [address] [place], [place] [address]    Special Requests   Final    BOTTLES DRAWN AEROBIC AND ANAEROBIC Blood Culture adequate volume Performed at [facility] [address] [place], [place] [address]    Culture   Final    NO GROWTH 2 DAYS Performed at [facility] [address] [address], [place], [place] [address]    Report Status PENDING  Incomplete  MRSA PCR Screening     Status: None   Collection Time: 09/25/18  6:33 AM  Result Value Ref Range Status   MRSA by PCR NEGATIVE NEGATIVE Final    Comment:        The GeneXpert MRSA Assay (FDA approved for NASAL specimens only), is one component of a comprehensive MRSA colonization surveillance program. It is not intended to diagnose MRSA infection nor to guide or monitor treatment for MRSA infections. Performed at [facility], [place] [address], [place], [place] [address]     Labs: BNP (last 3 results) No results for input(s): BNP in the last 8760 hours. Basic Metabolic Panel: Recent Labs  Lab 09/24/18 1508 09/24/18 2251 09/25/18 1001 09/26/18 0614 09/27/18 0643  NA 131*  --  135 135 135  K 6.6* 5.1 5.2* 5.2* 4.6  CL 100  --  107 107 109  CO2 20*  --  22 19* 20*   GLUCOSE 103*  --  89 100* 99  BUN 56*  --  47* 33* 30*  CREATININE 3.00*  --  2.37* 1.81* 1.83*  CALCIUM 7.8*  --  8.0* 8.1* 8.1*  MG  --   --  2.7* 2.5* 2.1  PHOS  --   --  6.8* 4.3 3.8   Liver Function Tests: Recent Labs  Lab 09/24/18 1508 09/25/18 1001 09/26/18 0614 09/27/18 0643  AST 37 33 301* 124*  ALT 58* 47* 235* 179*  ALKPHOS 553* 461* 1,159* 1,093*  BILITOT 0.6 0.6 0.8 0.4  PROT 6.7 5.8* 6.0* 5.7*  ALBUMIN 3.1* 2.6* 2.7* 2.5*   No results for input(s): LIPASE, AMYLASE in the last 168 hours. No results for input(s): AMMONIA in the last 168 hours. CBC: Recent Labs  Lab 09/24/18 1508 09/25/18 1001 09/26/18 0614 09/27/18 0643  WBC 12.7* 7.7 8.2 6.5  NEUTROABS 8.5* 5.4 5.4 3.8  HGB 8.3* 7.5* 8.5* 7.8*  HCT 25.4* 23.3* 27.2* 24.7*  MCV 98.1 97.9 101.5* 98.8  PLT 295 283 326 277   Cardiac Enzymes: No results for input(s): CKTOTAL, CKMB, CKMBINDEX, TROPONINI in the last 168 hours. BNP: Invalid input(s): POCBNP CBG: Recent Labs  Lab 09/25/18 0722 09/26/18 0756 09/27/18 0745 09/27/18 1130  GLUCAP 84 87 95 110*   D-Dimer No results for input(s): DDIMER in the last 72 hours. Hgb A1c No results for input(s): HGBA1C in the last 72 hours. Lipid Profile No results for input(s): CHOL, HDL, LDLCALC, TRIG, CHOLHDL, LDLDIRECT in the last 72 hours. Thyroid function studies No results for input(s): TSH, T4TOTAL, T3FREE, THYROIDAB in the last 72 hours.  Invalid input(s): FREET3 Anemia work up Recent Labs    09/26/18 0614 09/26/18 1057  VITAMINB12  --  769  FOLATE  --  9.6  FERRITIN  --  >7,500*  TIBC  --  181*  IRON  --  64  RETICCTPCT 3.6*  --    Urinalysis    Component Value Date/Time   COLORURINE YELLOW 09/24/2018 1450   APPEARANCEUR HAZY (A) 09/24/2018 1450   LABSPEC 1.012 09/24/2018 1450   PHURINE 5.0 09/24/2018 1450   GLUCOSEU NEGATIVE 09/24/2018 1450   HGBUR SMALL (A) 09/24/2018 1450   BILIRUBINUR NEGATIVE 09/24/2018 1450   KETONESUR  NEGATIVE 09/24/2018 1450   PROTEINUR >=300 (A) 09/24/2018 1450   NITRITE NEGATIVE 09/24/2018 1450   LEUKOCYTESUR TRACE (A) 09/24/2018 1450   Sepsis Labs Invalid input(s): PROCALCITONIN,  WBC,  LACTICIDVEN Microbiology Recent Results (from the past 240 hour(s))  Urine culture     Status: Abnormal   Collection Time: 09/24/18  2:50 PM  Result Value Ref Range Status   Specimen Description   Final    URINE, RANDOM Performed at [facility], [place] [address], [place], [place] [address]    Special Requests   Final    NONE Performed at [facility], [place] [address] [place], [place] [address]    Culture (A)  Final    <10,000 COLONIES/mL INSIGNIFICANT GROWTH Performed at [facility] [address], [place], [place] [address]    Report Status 09/26/2018 FINAL  Final  Blood Culture (routine x 2)     Status: None (Preliminary result)   Collection Time: 09/24/18  4:44 PM  Result Value Ref Range Status   Specimen Description   Final    BLOOD RIGHT ARM Performed at [place] [address], [place], [place] [address]    Special Requests   Final    BOTTLES DRAWN AEROBIC AND ANAEROBIC Blood Culture adequate volume Performed at [facility] [address], [place],  [address]    Culture  Final    NO GROWTH 2 DAYS Performed at [facility], [place] [address], [place], [place] [address]    Report Status PENDING  Incomplete  Blood Culture (routine x 2)     Status: None (Preliminary result)   Collection Time: 09/24/18  6:05 PM  Result Value Ref Range Status   Specimen Description   Final    BLOOD LEFT ARM Performed at [facility] [address] [place], [place] [address]    Special Requests   Final    BOTTLES DRAWN AEROBIC AND ANAEROBIC Blood Culture adequate volume Performed at [facility] [address] [place], [place] [address]    Culture    Final    NO GROWTH 2 DAYS Performed at [facility] [address], [place], [place] [address]    Report Status PENDING  Incomplete  MRSA PCR Screening     Status: None   Collection Time: 09/25/18  6:33 AM  Result Value Ref Range Status   MRSA by PCR NEGATIVE NEGATIVE Final    Comment:        The GeneXpert MRSA Assay (FDA approved for NASAL specimens only), is one component of a comprehensive MRSA colonization surveillance program. It is not intended to diagnose MRSA infection nor to guide or monitor treatment for MRSA infections. Performed at [facility], [place] [address] [place], [place] [address]    Time coordinating discharge: 35 minutes  SIGNED:  Kerney Elbe, DO [facility] 09/27/2018, 1:43 PM Pager is on [facility]  If 7PM-7AM, please contact night-coverage [email or web address] Password [email or web address]

## 2018-09-27 NOTE — Progress Notes (Signed)
Called to patient's room by telemetry stating that patient became bradycardic, with heart rate in the 30's.  When entering patient's room, patient had eyes rolled back, and had neck and back arched.  Patient did not respond to sternal rub, but was breathing and had a pulse.  When patient laid flat, she sat straight up in bed with eyes glassed over, and immediately went back to being somnolent.  Rapid response called, Dr. Luberta RobertsonPaged, narcan accessed from pyxis.  After speaking with Dr. Marland McalpineSheikh, narcan was pushed, and patient almost immediately came to, and was able to carry on conversation.  Patient adamantly denies taking anything, and currently will not allow staff to search her room.  Tele-sitter ordered so that patient can be monitored.  Visitor had just left prior to episode.

## 2018-09-29 LAB — CULTURE, BLOOD (ROUTINE X 2)
CULTURE: NO GROWTH
Culture: NO GROWTH
Special Requests: ADEQUATE
Special Requests: ADEQUATE

## 2019-09-07 DEATH — deceased

## 2019-09-28 IMAGING — MR MR 3D RECON AT [facility]
4 of 6 series · 21 of 40 positions shown · non-contrast
Comparison: 09/26/2018 right upper quadrant abdominal sonogram.

CLINICAL DATA: Elevated liver function tests. History of
cholecystectomy in 9119. Prominent CBD on sonography.

EXAM:
MRI ABDOMEN WITHOUT CONTRAST  (INCLUDING MRCP)
TECHNIQUE: Multiplanar multisequence MR imaging of the abdomen was performed.
Heavily T2-weighted images of the biliary and pancreatic ducts were
obtained, and three-dimensional MRCP images were rendered by post
processing.

[Series 4: T2 fat-sat · axial · 5.0mm · 0.86mm/px · z∈[+2,+267]mm · 4 of 54 slices shown]
[im 1/54]
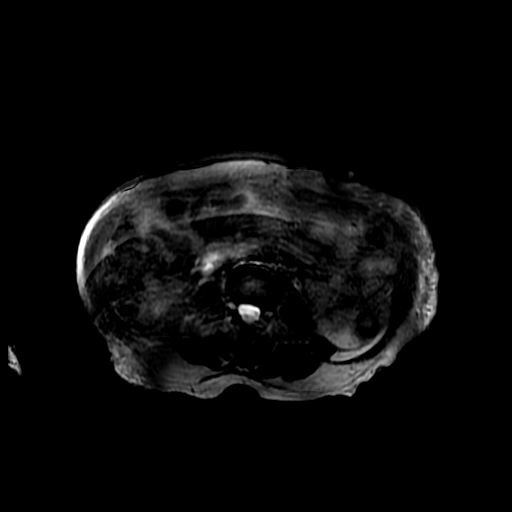
[im 18/54]
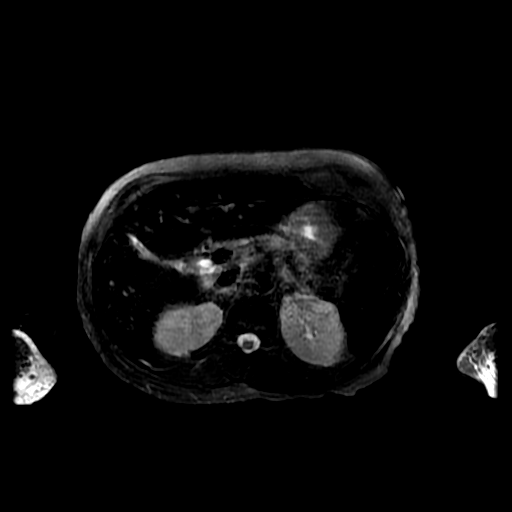
[im 36/54]
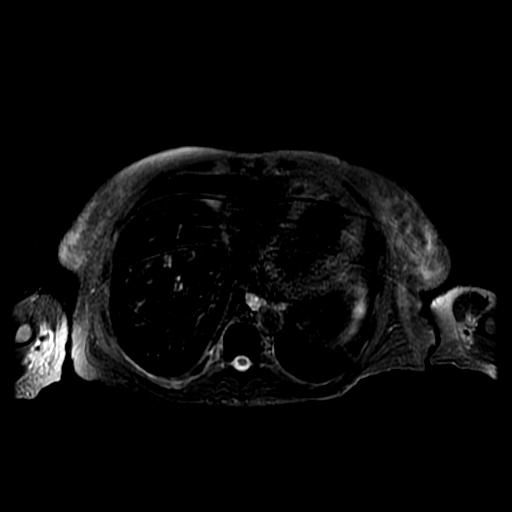
[im 54/54]
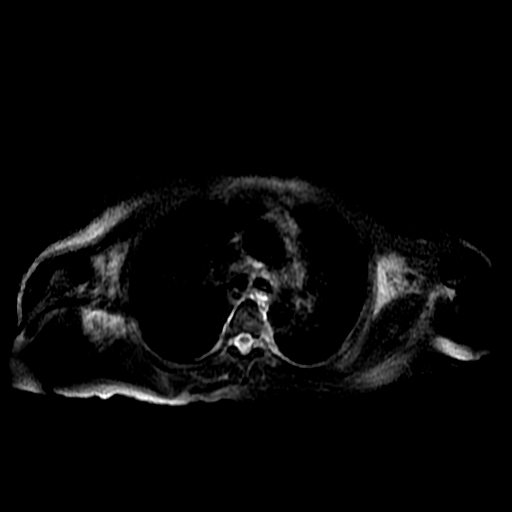

[Series 6: MRCP · coronal · 2.0mm · 0.70mm/px · 6 of 64 slices shown]
[im 1/64]
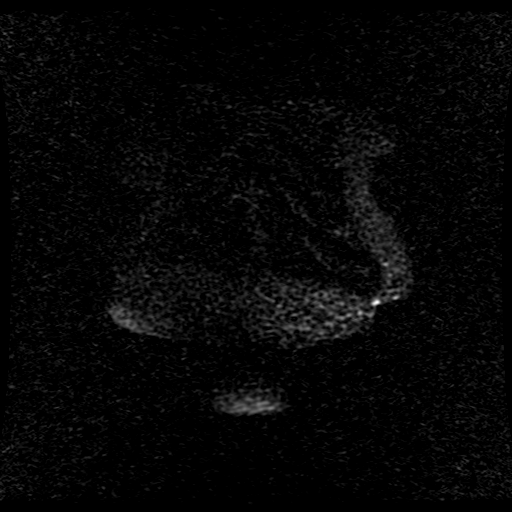
[im 13/64]
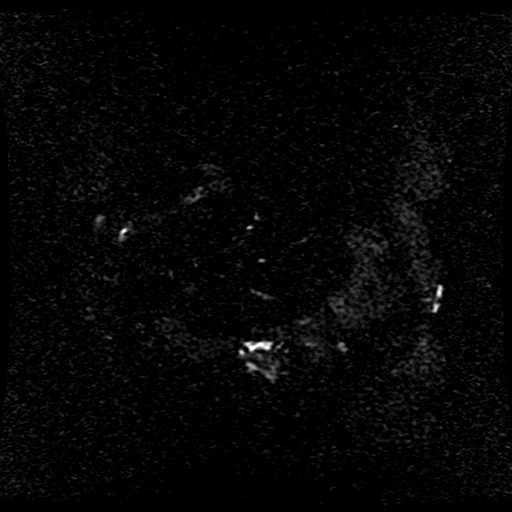
[im 26/64]
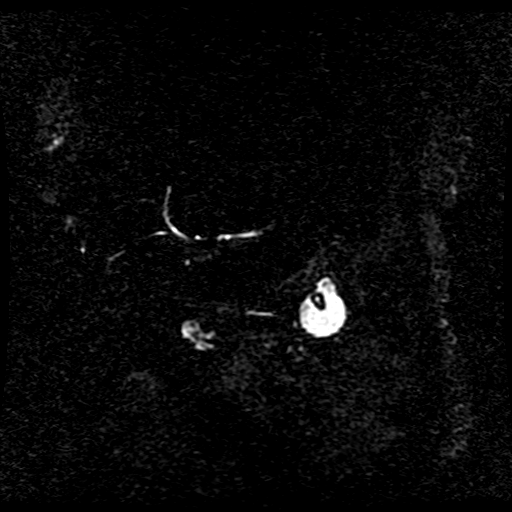
[im 38/64]
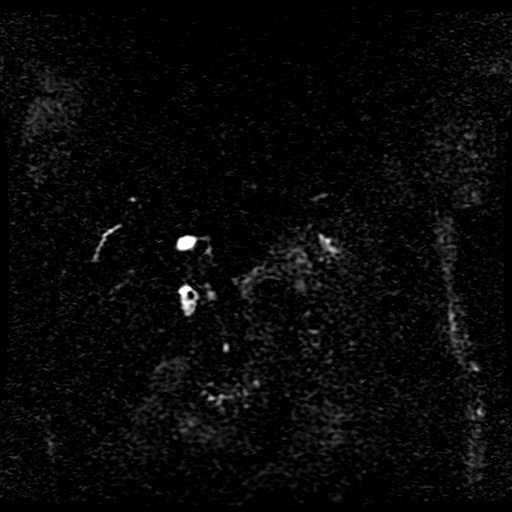
[im 51/64]
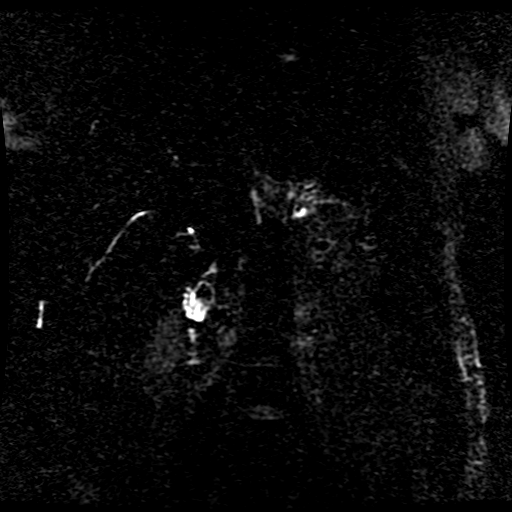
[im 64/64]
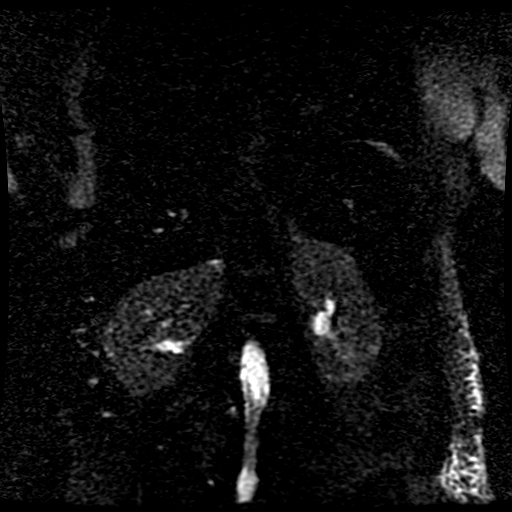

[Series 7: DWI b500 · axial · 6.0mm · 1.76mm/px · z∈[-19,+277]mm · 4 of 38 slices shown]
[im 1/38]
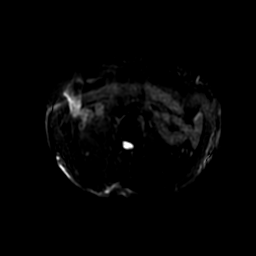
[im 13/38]
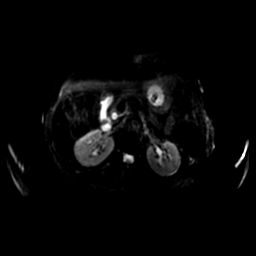
[im 25/38]
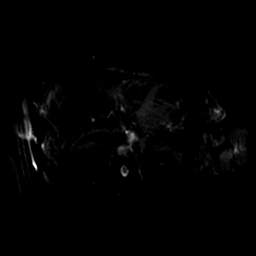
[im 38/38]
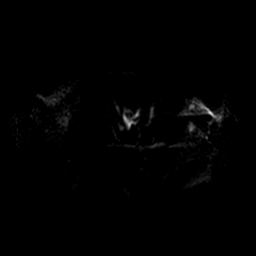

[Series 500: reformatted · axial · 1.6mm · 0.62mm/px · z∈[+80,+209]mm · 7 of 83 slices shown]
[im 1/83]
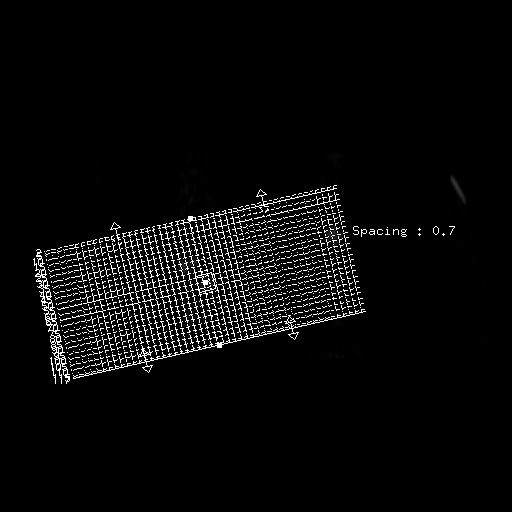
[im 12/83]
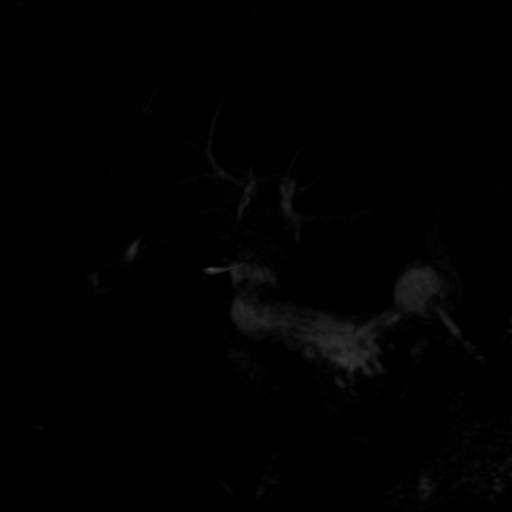
[im 24/83]
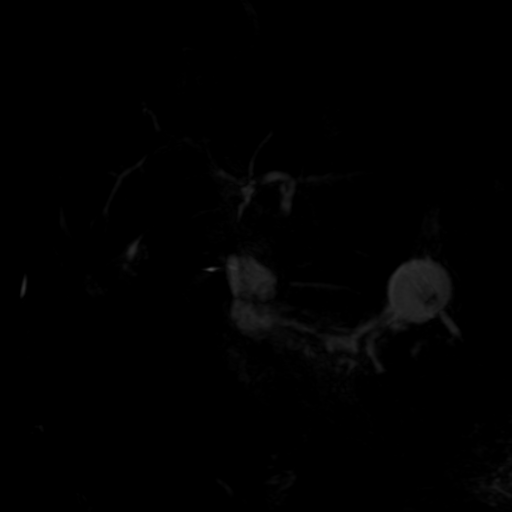
[im 36/83]
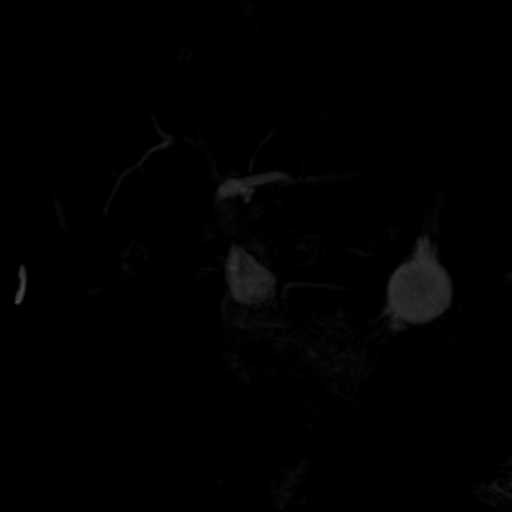
[im 47/83]
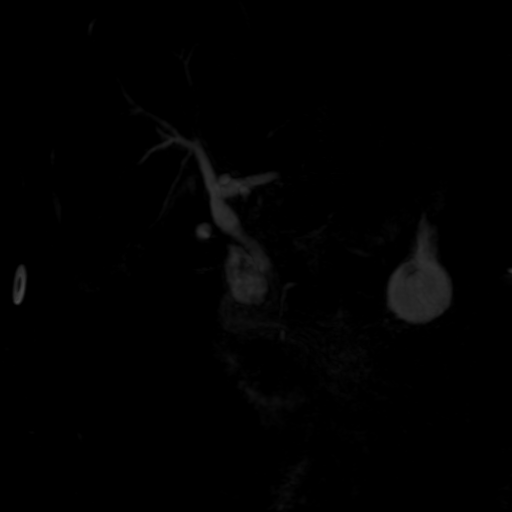
[im 59/83]
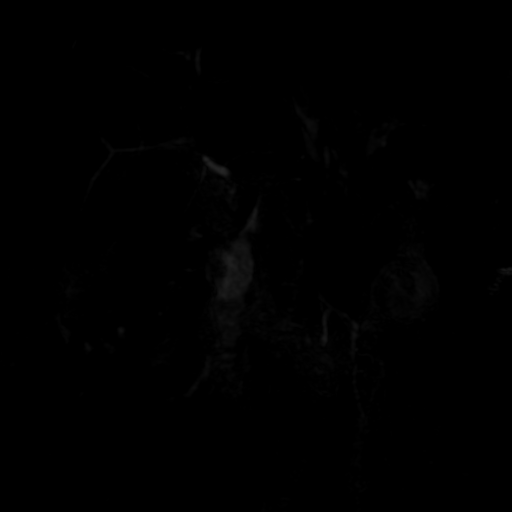
[im 71/83]
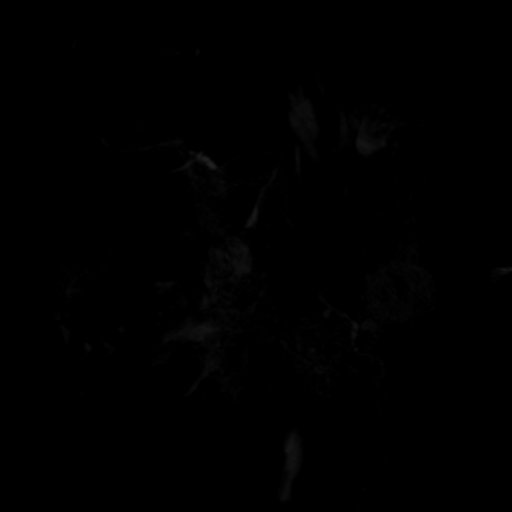

[21 of 40 positions shown; findings below may reference images not displayed]

FINDINGS: The MRI/MRCP study is incomplete due to early termination at the
patient's request. In particular, no T1 weighted imaging was
performed. DWI sequence is incomplete. Scan is limited by motion
degradation.

Lower chest: Mild scarring versus atelectasis at the dependent lung
bases.

Hepatobiliary: Normal liver size and configuration. Diffusely
hypointense liver parenchyma on T2 weighted imaging suggesting
hemosiderosis. No evidence of a liver mass on these limited
sequences. Cholecystectomy. Bile ducts are within normal post
cholecystectomy limits. Common bile duct diameter 8 mm, with smooth
distal tapering. No filling defects to suggest choledocholithiasis.
No biliary strictures.

Pancreas: No duct dilation or appreciable mass in the pancreas on
limited views. No pancreas divisum.

Spleen: Low T2 signal intensity throughout normal size spleen,
suggesting hemosiderosis. No splenic mass.

Adrenals/Urinary Tract: No discrete adrenal nodules. No
hydronephrosis. Normal size kidneys. No renal masses.

Stomach/Bowel: Stomach is nondistended. Percutaneous gastrostomy
tube is in place in the proximal stomach. Visualized small and large
bowel is normal caliber, with no bowel wall thickening.

Vascular/Lymphatic: Normal caliber abdominal aorta. No
pathologically enlarged lymph nodes in the abdomen.

Other: No abdominal ascites or focal fluid collection.

Musculoskeletal: No aggressive appearing focal osseous lesions.
IMPRESSION: 1. Limited motion degraded incomplete MRI/MRCP, see comments.
2. Bile ducts are within normal post cholecystectomy limits. CBD
diameter 8 mm. No evidence of choledocholithiasis.
3. Suggestion of hemosiderosis in the liver and spleen, probably
transfusional.
